# Patient Record
Sex: Male | Born: 1940 | Race: White | Hispanic: No | Marital: Married | Smoking: Never smoker
Health system: Southern US, Community
[De-identification: ages and names within clinical notes are randomized; demographics above are authoritative.]

## PROBLEM LIST (undated history)

## (undated) DIAGNOSIS — I2510 Atherosclerotic heart disease of native coronary artery without angina pectoris: Secondary | ICD-10-CM

## (undated) DIAGNOSIS — K449 Diaphragmatic hernia without obstruction or gangrene: Secondary | ICD-10-CM

## (undated) DIAGNOSIS — K219 Gastro-esophageal reflux disease without esophagitis: Secondary | ICD-10-CM

## (undated) DIAGNOSIS — E785 Hyperlipidemia, unspecified: Secondary | ICD-10-CM

## (undated) DIAGNOSIS — I10 Essential (primary) hypertension: Secondary | ICD-10-CM

## (undated) DIAGNOSIS — Z950 Presence of cardiac pacemaker: Secondary | ICD-10-CM

## (undated) HISTORY — DX: Diaphragmatic hernia without obstruction or gangrene: K44.9

## (undated) HISTORY — PX: CHOLECYSTECTOMY: SHX55

## (undated) HISTORY — DX: Essential (primary) hypertension: I10

## (undated) HISTORY — DX: Gastro-esophageal reflux disease without esophagitis: K21.9

## (undated) HISTORY — DX: Presence of cardiac pacemaker: Z95.0

## (undated) HISTORY — DX: Hyperlipidemia, unspecified: E78.5

## (undated) HISTORY — PX: PACEMAKER PLACEMENT: SHX43

## (undated) HISTORY — DX: Atherosclerotic heart disease of native coronary artery without angina pectoris: I25.10

---

## 2001-05-02 DIAGNOSIS — I2510 Atherosclerotic heart disease of native coronary artery without angina pectoris: Secondary | ICD-10-CM

## 2001-05-02 DIAGNOSIS — Z950 Presence of cardiac pacemaker: Secondary | ICD-10-CM

## 2001-05-02 HISTORY — DX: Presence of cardiac pacemaker: Z95.0

## 2001-05-02 HISTORY — DX: Atherosclerotic heart disease of native coronary artery without angina pectoris: I25.10

## 2005-07-24 ENCOUNTER — Emergency Department: Payer: Self-pay | Admitting: Emergency Medicine

## 2005-07-24 ENCOUNTER — Other Ambulatory Visit: Payer: Self-pay

## 2005-09-13 ENCOUNTER — Inpatient Hospital Stay: Payer: Self-pay | Admitting: Internal Medicine

## 2005-09-13 ENCOUNTER — Other Ambulatory Visit: Payer: Self-pay

## 2005-09-14 ENCOUNTER — Other Ambulatory Visit: Payer: Self-pay

## 2006-02-17 ENCOUNTER — Ambulatory Visit: Payer: Self-pay | Admitting: Gastroenterology

## 2006-05-04 ENCOUNTER — Ambulatory Visit: Payer: Self-pay | Admitting: Gastroenterology

## 2006-07-03 ENCOUNTER — Ambulatory Visit: Payer: Self-pay | Admitting: Surgery

## 2006-07-07 ENCOUNTER — Ambulatory Visit: Payer: Self-pay | Admitting: Surgery

## 2006-10-15 ENCOUNTER — Other Ambulatory Visit: Payer: Self-pay

## 2006-10-15 ENCOUNTER — Emergency Department: Payer: Self-pay

## 2007-10-30 ENCOUNTER — Ambulatory Visit: Payer: Self-pay | Admitting: Gastroenterology

## 2011-01-04 ENCOUNTER — Other Ambulatory Visit: Payer: Self-pay | Admitting: Internal Medicine

## 2011-01-04 MED ORDER — GLIPIZIDE ER 2.5 MG PO TB24: 2.5000 mg | ORAL_TABLET | Freq: Every day | ORAL | Status: DC

## 2011-02-07 ENCOUNTER — Other Ambulatory Visit: Payer: Self-pay | Admitting: Internal Medicine

## 2011-02-08 ENCOUNTER — Encounter: Payer: Self-pay | Admitting: Internal Medicine

## 2011-02-08 ENCOUNTER — Ambulatory Visit (INDEPENDENT_AMBULATORY_CARE_PROVIDER_SITE_OTHER): Payer: Medicare Other | Admitting: Internal Medicine

## 2011-02-08 VITALS — BP 128/70 | HR 91 | Temp 98.3°F | Resp 16 | Ht 73.0 in | Wt 193.0 lb

## 2011-02-08 DIAGNOSIS — I10 Essential (primary) hypertension: Secondary | ICD-10-CM | POA: Insufficient documentation

## 2011-02-08 DIAGNOSIS — K449 Diaphragmatic hernia without obstruction or gangrene: Secondary | ICD-10-CM | POA: Insufficient documentation

## 2011-02-08 DIAGNOSIS — I2510 Atherosclerotic heart disease of native coronary artery without angina pectoris: Secondary | ICD-10-CM | POA: Insufficient documentation

## 2011-02-08 DIAGNOSIS — E785 Hyperlipidemia, unspecified: Secondary | ICD-10-CM

## 2011-02-08 DIAGNOSIS — E119 Type 2 diabetes mellitus without complications: Secondary | ICD-10-CM

## 2011-02-08 DIAGNOSIS — Z950 Presence of cardiac pacemaker: Secondary | ICD-10-CM | POA: Insufficient documentation

## 2011-02-08 DIAGNOSIS — J329 Chronic sinusitis, unspecified: Secondary | ICD-10-CM

## 2011-02-08 DIAGNOSIS — E1165 Type 2 diabetes mellitus with hyperglycemia: Secondary | ICD-10-CM | POA: Insufficient documentation

## 2011-02-08 MED ORDER — ALPRAZOLAM 0.25 MG PO TABS: 0.2500 mg | ORAL_TABLET | Freq: Every evening | ORAL | Status: DC | PRN

## 2011-02-08 MED ORDER — AMOXICILLIN-POT CLAVULANATE 875-125 MG PO TABS: 1.0000 | ORAL_TABLET | Freq: Two times a day (BID) | ORAL | Status: AC

## 2011-02-08 MED ORDER — GLIPIZIDE ER 2.5 MG PO TB24: 2.5000 mg | ORAL_TABLET | Freq: Every day | ORAL | Status: DC

## 2011-02-08 NOTE — Patient Instructions (Addendum)
You need to use Sudafed PE  10 mg every 6 hours until evening to manage the congestion, can use Afrin at bedtime if it makes you jittery.    I am prescribing Augmentin for the infection,  One tablet twice daily for 10 days,  Gargle with salt water for sore throat.   Continue using your cough  Medicine as needed for cough.  You can use Debrox ear drops to soften your ear wax.

## 2011-02-08 NOTE — Progress Notes (Signed)
Subjective:    Patient ID: Edwin Evans, male    DOB: 06-28-1940, 70 y.o.   MRN: [id number]  HPI  Mr. Hodgkin is a 70 yo white male with a history of diabetes mellitus who is here for his  annual exam.  Had a recent bout of esophagitis due to hiatal hernia after lifting something heavy.  His symptoms of esophgael pain are currently  resolved.  He is scheduled to see [facility] for hernia reevaluation. He has been having sinusitis symptoms lasting 4 or 5 days with frontal sinus pain, eye pain, nNasal drainage that is yellow, and hoarseness.  Feel he is generally better but still coughing,  He also had a molar pulled 6 weeks ago, required a bone graft,  Left mandible.  was on antiobiotics then for amoxicillin.  He has reduced his Actos dose  from 45 mg to 30 mg every other day with improved symptomatology  But did not bring a log of his sugars.  No past medical history on file. Current Outpatient Prescriptions on File Prior to Visit  Medication Sig Dispense Refill  . glipiZIDE (GLIPIZIDE XL) 2.5 MG 24 hr tablet Take 1 tablet (2.5 mg total) by mouth daily.  30 tablet  3     Review of Systems  Constitutional: Negative for fever, chills, diaphoresis, activity change, appetite change, fatigue and unexpected weight change.  HENT: Positive for hearing loss, ear pain, congestion, sneezing, dental problem and sinus pressure. Negative for nosebleeds, sore throat, facial swelling, rhinorrhea, drooling, mouth sores, trouble swallowing, neck pain, neck stiffness, voice change, postnasal drip, tinnitus and ear discharge.   Eyes: Negative for photophobia, pain, discharge, redness, itching and visual disturbance.  Respiratory: Negative for apnea, cough, choking, chest tightness, shortness of breath, wheezing and stridor.   Cardiovascular: Negative for chest pain, palpitations and leg swelling.  Gastrointestinal: Negative for nausea, vomiting, abdominal pain, diarrhea, constipation, blood in stool, abdominal  distention, anal bleeding and rectal pain.  Genitourinary: Negative for dysuria, urgency, frequency, hematuria, flank pain, decreased urine volume, scrotal swelling, difficulty urinating and testicular pain.  Musculoskeletal: Negative for myalgias, back pain, joint swelling, arthralgias and gait problem.  Skin: Negative for color change, rash and wound.  Neurological: Negative for dizziness, tremors, seizures, syncope, speech difficulty, weakness, light-headedness, numbness and headaches.  Psychiatric/Behavioral: Negative for suicidal ideas, hallucinations, behavioral problems, confusion, sleep disturbance, dysphoric mood, decreased concentration and agitation. The patient is not nervous/anxious.    BP 128/70  Pulse 91  Temp(Src) 98.3 F (36.8 C) (Oral)  Resp 16  Ht 6\' 1"  (1.854 m)  Wt 193 lb (87.544 kg)  BMI 25.46 kg/m2  SpO2 96%     Objective:   Physical Exam  Constitutional: He is oriented to person, place, and time.  HENT:  Head: Normocephalic and atraumatic.  Right Ear: A middle ear effusion is present.  Left Ear: External ear normal.  Ears:  Mouth/Throat: Oropharynx is clear and moist.  Eyes: Conjunctivae and EOM are normal.  Neck: Normal range of motion. Neck supple. No JVD present. No thyromegaly present.  Cardiovascular: Normal rate, regular rhythm and normal heart sounds.   Pulmonary/Chest: Effort normal and breath sounds normal. He has no wheezes. He has no rales.  Abdominal: Soft. Bowel sounds are normal. He exhibits no mass. There is no tenderness. There is no rebound.  Genitourinary: Prostate normal and penis normal.  Musculoskeletal: Normal range of motion. He exhibits no edema.  Neurological: He is alert and oriented to person, place, and time.  Skin: Skin is warm and dry.  Psychiatric: He has a normal mood and affect.          Assessment & Plan:

## 2011-02-10 ENCOUNTER — Other Ambulatory Visit: Payer: Self-pay | Admitting: Internal Medicine

## 2011-02-11 MED ORDER — HYDRALAZINE HCL 50 MG PO TABS: 50.0000 mg | ORAL_TABLET | Freq: Three times a day (TID) | ORAL | Status: DC

## 2011-03-10 ENCOUNTER — Encounter: Payer: Self-pay | Admitting: Internal Medicine

## 2011-03-14 ENCOUNTER — Other Ambulatory Visit: Payer: Self-pay | Admitting: Internal Medicine

## 2011-03-15 MED ORDER — ASPIRIN 81 MG PO CHEW: 81.0000 mg | CHEWABLE_TABLET | Freq: Every day | ORAL | Status: AC

## 2011-03-15 MED ORDER — GLIPIZIDE ER 2.5 MG PO TB24: 2.5000 mg | ORAL_TABLET | Freq: Every day | ORAL | Status: DC

## 2011-03-21 ENCOUNTER — Telehealth: Payer: Self-pay | Admitting: Internal Medicine

## 2011-03-21 NOTE — Telephone Encounter (Signed)
[phone] Pt called and wants you to call the pharmacy [facility] [phone]  the rx said 1 a day.   Pt stated stated it was to say 2 a day  The rx glipizide xl 2.5mg  2 x daily.

## 2011-03-22 MED ORDER — GLIPIZIDE ER 2.5 MG PO TB24: 2.5000 mg | ORAL_TABLET | Freq: Two times a day (BID) | ORAL | Status: DC

## 2011-06-01 ENCOUNTER — Other Ambulatory Visit: Payer: Self-pay | Admitting: *Deleted

## 2011-06-01 MED ORDER — HYDRALAZINE HCL 50 MG PO TABS: 50.0000 mg | ORAL_TABLET | Freq: Three times a day (TID) | ORAL | Status: DC

## 2011-08-01 ENCOUNTER — Other Ambulatory Visit: Payer: Self-pay | Admitting: Internal Medicine

## 2011-08-01 MED ORDER — GLIPIZIDE ER 2.5 MG PO TB24: 2.5000 mg | ORAL_TABLET | Freq: Two times a day (BID) | ORAL | Status: DC

## 2011-08-09 ENCOUNTER — Ambulatory Visit (INDEPENDENT_AMBULATORY_CARE_PROVIDER_SITE_OTHER): Payer: Medicare Other | Admitting: Internal Medicine

## 2011-08-09 ENCOUNTER — Encounter: Payer: Self-pay | Admitting: Internal Medicine

## 2011-08-09 VITALS — BP 150/82 | HR 70 | Temp 98.6°F | Resp 16 | Wt 192.2 lb

## 2011-08-09 DIAGNOSIS — I10 Essential (primary) hypertension: Secondary | ICD-10-CM

## 2011-08-09 DIAGNOSIS — K219 Gastro-esophageal reflux disease without esophagitis: Secondary | ICD-10-CM

## 2011-08-09 DIAGNOSIS — E1165 Type 2 diabetes mellitus with hyperglycemia: Secondary | ICD-10-CM

## 2011-08-09 DIAGNOSIS — IMO0001 Reserved for inherently not codable concepts without codable children: Secondary | ICD-10-CM

## 2011-08-09 NOTE — Assessment & Plan Note (Addendum)
Overdue for labs  But not fasting so will get hgba1c.,  Urine test and LDL.  Takes fish oil..  //actos was stopped at last vst ue to multiple concerns

## 2011-08-09 NOTE — Progress Notes (Signed)
Patient ID: Edwin Evans, male   DOB: [birth year], 71 y.o.   MRN: [id number]     Patient Active Problem List  Diagnoses  . Diabetes mellitus type 2, uncontrolled  . Hypertension  . Coronary artery disease  . Pacemaker  . Gastroesophageal reflux disease with hiatal hernia    Subjective:  CC:   Chief Complaint  Patient presents with  . Follow-up    HPI:   Edwin Klem Coxis a 71 y.o. male who presents for follow up on diabetes.  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  Attributing it to taking turmeric capsules two daily for the past 3 to four years.    Sleeping 8 hours daily, for the first ttime in e awhile.  Drinks one one glass of wine before bed .  Energy level has improved since stopping Actos but he developed constipation which he has treated with laxatives prn,  Stool softeners,  Extra fiber ,  But attributes resolution to eating oatmeal with flax seed and raisins.  And chopped pecans .  Eats a lot of nuts.    Past Medical History  Diagnosis Date  . Diabetes mellitus   . Hypertension   . Coronary artery disease   . Pacemaker 2003  . GERD (gastroesophageal reflux disease)   . Gastroesophageal reflux disease with hiatal hernia     Past Surgical History  Procedure Date  . Cholecystectomy          The following portions of the patient's history were reviewed and updated as appropriate: Allergies, current medications, and problem list.    Review of Systems:   12 Pt  review of systems was negative except those addressed in the HPI,     History   Social History  . Marital Status: Married    Spouse Name: N/A    Number of Children: N/A  . Years of Education: N/A   Occupational History  . Not on file.   Social History Main Topics  . Smoking status: Never Smoker   . Smokeless tobacco: Never Used  . Alcohol Use: Yes  . Drug Use: No  . Sexually Active: Not on file    Other Topics Concern  . Not on file   Social History Narrative  . No narrative on file    Objective:  BP 150/82  Pulse 70  Temp(Src) 98.6 F (37 C) (Oral)  Resp 16  Wt 192 lb 4 oz (87.204 kg)  SpO2 96%  General appearance: alert, cooperative and appears stated age Ears: normal TM's and external ear canals both ears Throat: lips, mucosa, and tongue normal; teeth and gums normal Neck: no adenopathy, no carotid bruit, supple, symmetrical, trachea midline and thyroid not enlarged, symmetric, no tenderness/mass/nodules Back: symmetric, no curvature. ROM normal. No CVA tenderness. Lungs: clear to auscultation bilaterally Heart: regular rate and rhythm, S1, S2 normal, no murmur, click, rub or gallop Abdomen: soft, non-tender; bowel sounds normal; no masses,  no organomegaly Pulses: 2+ and symmetric Skin: Skin color, texture, turgor normal. No rashes or lesions Lymph nodes: Cervical, supraclavicular, and axillary nodes normal.  Assessment and Plan:  Gastroesophageal reflux disease with hiatal hernia No longer symptomatic since stopping Actos.     Diabetes mellitus type 2, uncontrolled Overdue for labs  But not fasting so will get hgba1c.,  Urine test and LDL.  Takes fish oil..  //actos was stopped at last vst ue to multiple  concerns  Hypertension Not controlled today, but at last visit was fine.  He is currently having a sinus headache and allergies.      Updated Medication List Outpatient Encounter Prescriptions as of 08/09/2011  Medication Sig Dispense Refill  . ALPRAZolam (XANAX) 0.25 MG tablet Take 1 tablet (0.25 mg total) by mouth at bedtime as needed.  30 tablet  2  . aspirin (ASPIRIN CHILDRENS) 81 MG chewable tablet Chew 1 tablet (81 mg total) by mouth daily.  36 tablet  11  . Cholecalciferol (VITAMIN D3) 1000 UNITS CAPS Take 3,000 Units by mouth daily.       . fish oil-omega-3 fatty acids 1000 MG capsule Take 2 g by mouth daily.        [name] glipiZIDE (GLIPIZIDE XL)  2.5 MG 24 hr tablet Take 1 tablet (2.5 mg total) by mouth 2 (two) times daily.  60 tablet  3  . hydrALAZINE (APRESOLINE) 50 MG tablet Take 1 tablet (50 mg total) by mouth 3 (three) times daily.  90 tablet  3  . losartan (COZAAR) 100 MG tablet Take 100 mg by mouth daily.        . TURMERIC PO Take by mouth.        . vitamin B-12 (CYANOCOBALAMIN) 100 MCG tablet Take 50 mcg by mouth daily.        [name] DISCONTD: pioglitazone (ACTOS) 15 MG tablet Take 15 mg by mouth every other day.           Orders Placed This Encounter  Procedures  . Microalbumin / creatinine urine ratio  . Hemoglobin A1c  . Comp Met (CMET)  . Direct LDL  . HM COLONOSCOPY    No Follow-up on file.

## 2011-08-09 NOTE — Patient Instructions (Signed)
Try using Simply saline to flush the pollen from your sinuses.  (twice daily)  I will call you with the results of your labs.

## 2011-08-09 NOTE — Assessment & Plan Note (Signed)
Not controlled today, but at last visit was fine.  He is currently having a sinus headache and allergies.

## 2011-08-09 NOTE — Assessment & Plan Note (Signed)
No longer symptomatic since stopping Actos.

## 2011-08-10 LAB — COMPREHENSIVE METABOLIC PANEL
AST: 18 U/L (ref 0–37)
Albumin: 4.5 g/dL (ref 3.5–5.2)
BUN: 25 mg/dL — ABNORMAL HIGH (ref 6–23)
CO2: 26 mEq/L (ref 19–32)
Calcium: 9.4 mg/dL (ref 8.4–10.5)
Chloride: 103 mEq/L (ref 96–112)
GFR: 95.54 mL/min (ref >60.00)
Glucose, Bld: 245 mg/dL — ABNORMAL HIGH (ref 70–99)
Potassium: 4.3 mEq/L (ref 3.5–5.1)

## 2011-08-10 LAB — MICROALBUMIN / CREATININE URINE RATIO: Microalb Creat Ratio: 0.4 mg/g (ref 0.0–30.0)

## 2011-08-15 ENCOUNTER — Encounter: Payer: Self-pay | Admitting: Internal Medicine

## 2011-09-07 ENCOUNTER — Emergency Department: Payer: Self-pay | Admitting: Emergency Medicine

## 2011-09-07 LAB — COMPREHENSIVE METABOLIC PANEL
Alkaline Phosphatase: 63 U/L (ref 50–136)
BUN: 20 mg/dL — ABNORMAL HIGH (ref 7–18)
Calcium, Total: 9 mg/dL (ref 8.5–10.1)
Chloride: 107 mmol/L (ref 98–107)
Co2: 26 mmol/L (ref 21–32)
Creatinine: 0.86 mg/dL (ref 0.60–1.30)
Glucose: 141 mg/dL — ABNORMAL HIGH (ref 65–99)
Potassium: 4 mmol/L (ref 3.5–5.1)
SGPT (ALT): 24 U/L
Sodium: 141 mmol/L (ref 136–145)
Total Protein: 7.5 g/dL (ref 6.4–8.2)

## 2011-09-07 LAB — URINALYSIS, COMPLETE
Bacteria: NONE SEEN
Bilirubin,UR: NEGATIVE
Glucose,UR: NEGATIVE mg/dL (ref 0–75)
Hyaline Cast: 1
Leukocyte Esterase: NEGATIVE
Nitrite: NEGATIVE
Ph: 6 (ref 4.5–8.0)
Protein: NEGATIVE
Squamous Epithelial: NONE SEEN
WBC UR: <1 /HPF (ref 0–5)

## 2011-09-07 LAB — CBC
MCH: 29.3 pg (ref 26.0–34.0)
MCV: 88 fL (ref 80–100)
Platelet: 143 10*3/uL — ABNORMAL LOW (ref 150–440)
RBC: 5.45 10*6/uL (ref 4.40–5.90)
RDW: 13.6 % (ref 11.5–14.5)
WBC: 7.5 10*3/uL (ref 3.8–10.6)

## 2011-09-07 LAB — CK TOTAL AND CKMB (NOT AT ARMC)
CK, Total: 72 U/L (ref 35–232)
CK-MB: 1.9 ng/mL (ref 0.5–3.6)

## 2011-09-07 LAB — TROPONIN I: Troponin-I: <0.02 ng/mL

## 2011-10-07 ENCOUNTER — Other Ambulatory Visit: Payer: Self-pay | Admitting: Internal Medicine

## 2011-10-08 MED ORDER — ALPRAZOLAM 0.25 MG PO TABS: 0.2500 mg | ORAL_TABLET | Freq: Every evening | ORAL | Status: DC | PRN

## 2011-10-13 ENCOUNTER — Ambulatory Visit: Payer: Self-pay | Admitting: Gastroenterology

## 2011-10-17 ENCOUNTER — Encounter: Payer: Self-pay | Admitting: Internal Medicine

## 2011-11-25 LAB — HEMOGLOBIN A1C: Hgb A1c MFr Bld: 7.5 % — AB (ref 4.0–6.0)

## 2011-12-06 ENCOUNTER — Ambulatory Visit: Payer: Medicare Other | Admitting: Internal Medicine

## 2011-12-09 ENCOUNTER — Ambulatory Visit (INDEPENDENT_AMBULATORY_CARE_PROVIDER_SITE_OTHER): Payer: Medicare Other | Admitting: Internal Medicine

## 2011-12-09 ENCOUNTER — Encounter: Payer: Self-pay | Admitting: Internal Medicine

## 2011-12-09 VITALS — BP 162/70 | HR 60 | Temp 98.0°F | Resp 16 | Wt 189.5 lb

## 2011-12-09 DIAGNOSIS — E1165 Type 2 diabetes mellitus with hyperglycemia: Secondary | ICD-10-CM

## 2011-12-09 DIAGNOSIS — I10 Essential (primary) hypertension: Secondary | ICD-10-CM

## 2011-12-09 DIAGNOSIS — K449 Diaphragmatic hernia without obstruction or gangrene: Secondary | ICD-10-CM

## 2011-12-09 DIAGNOSIS — Z79899 Other long term (current) drug therapy: Secondary | ICD-10-CM

## 2011-12-09 DIAGNOSIS — K219 Gastro-esophageal reflux disease without esophagitis: Secondary | ICD-10-CM

## 2011-12-09 DIAGNOSIS — R079 Chest pain, unspecified: Secondary | ICD-10-CM

## 2011-12-09 DIAGNOSIS — I2510 Atherosclerotic heart disease of native coronary artery without angina pectoris: Secondary | ICD-10-CM

## 2011-12-09 LAB — COMPREHENSIVE METABOLIC PANEL
ALT: 18 U/L (ref 0–53)
AST: 21 U/L (ref 0–37)
Alkaline Phosphatase: 80 U/L (ref 39–117)
Creatinine, Ser: 0.9 mg/dL (ref 0.4–1.5)
Total Bilirubin: 0.8 mg/dL (ref 0.3–1.2)

## 2011-12-09 MED ORDER — HYDRALAZINE HCL 50 MG PO TABS: 50.0000 mg | ORAL_TABLET | Freq: Three times a day (TID) | ORAL | Status: DC

## 2011-12-09 MED ORDER — GLIPIZIDE ER 2.5 MG PO TB24: ORAL_TABLET | ORAL | Status: DC

## 2011-12-09 MED ORDER — GLIPIZIDE 2.5 MG HALF TABLET: 2.5000 mg | ORAL_TABLET | Freq: Two times a day (BID) | ORAL | Status: DC

## 2011-12-09 NOTE — Progress Notes (Signed)
Patient ID: Edwin Evans, male   DOB: October 29, 1940, 71 y.o.   MRN: [id number]  Patient Active Problem List  Diagnosis  . Diabetes mellitus type 2, uncontrolled  . Hypertension  . Coronary artery disease  . Pacemaker  . Gastroesophageal reflux disease with hiatal hernia  . Chest pain at rest  . Hyperlipidemia    Subjective:  CC:   Chief Complaint  Patient presents with  . Follow-up    HPI:   Edwin Cragle Coxis a 71 y.o. male who presents Follow up on diabetes and coronary artery disease  He had his a1c checked at [facility] pharmacy last week and it was 7.5,  which is improved from his previous A1c of 8.06 months ago.  His dietary differences in include cutting back on the oatmeal which he was eating frequently, the instant kind .  Had chest pain in May,  Was observed for 8 hours in ED with 2 sets of enzymes normal.  He was referred to his cardiologist and underwent a stress echo by Dr. [name] which was again positive and patient did have chest pain during it.  He was recommended to have a cardiac cath but has deferred. He has had prior cardiac catheterizations which have showed a distal blockage not amenable to stenting .   He doesn't trust the tests and doesn't want to have another cardiac catheterization unless it is done to the radial artery.  He discussed his symptoms with a non-physician friend who is a cardiology researcher  and is now concerned that his sliding hiatal hernia is the source of his chest pain, since  the pain typically is brought on by eating or bending over.  He was seen  by  [facility] GI.  Had the upper GI /barium study done at [facility] which showed: slight reflux, mild esophageal stricture vs acahalasia (patient not sure).   he would like a second opinion from a different GI and cardiology group.    Past Medical History  Diagnosis Date  . Diabetes mellitus   . Hypertension   . Coronary artery disease 2003    Noncritical stenoses by prior catheterization  . 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 GERD (gastroesophageal reflux disease)   . Gastroesophageal reflux disease with hiatal hernia   . Hyperlipidemia     Past Surgical History  Procedure Date  . Cholecystectomy          The following portions of the patient's history were reviewed and updated as appropriate: Allergies, current medications, and problem list.    Review of Systems:   A comprehensive ROS was done and positive for chest pain  Per HPI. The rest was negative.     History   Social History  . Marital Status: Married    Spouse Name: N/A    Number of Children: N/A  . Years of Education: N/A   Occupational History  . Not on file.   Social History Main Topics  . Smoking status: Never Smoker   . Smokeless tobacco: Never Used  . Alcohol Use: Yes  . Drug Use: No  . Sexually Active: Not on file   Other Topics Concern  . Not on file   Social History Narrative  . No narrative on file    Objective:  BP 162/70  Pulse 60  Temp 98 F (36.7 C) (Oral)  Resp 16  Wt 189 lb 8 oz (85.957 kg)  SpO2 95%  General appearance: alert, cooperative and appears  stated age Ears: normal TM's and external ear canals both ears Throat: lips, mucosa, and tongue normal; teeth and gums normal Neck: no adenopathy, no carotid bruit, supple, symmetrical, trachea midline and thyroid not enlarged, symmetric, no tenderness/mass/nodules Back: symmetric, no curvature. ROM normal. No CVA tenderness. Lungs: clear to auscultation bilaterally Heart: regular rate and rhythm, S1, S2 normal, no murmur, click, rub or gallop Abdomen: soft, non-tender; bowel sounds normal; no masses,  no organomegaly Pulses: 2+ and symmetric Skin: Skin color, texture, turgor normal. No rashes or lesions Lymph nodes: Cervical, supraclavicular, and axillary nodes normal.  Assessment and Plan:  Chest pain at rest Although he does have known coronary artery disease he is reluctant to submit another  cardiac cath without a second opinion from GI and cardiology regarding the source of his pain. I have referred him to Julio Alm as he has requested only male physicians if possible. I have suggested that he will probably need to undergo a repeat endoscopic or upper GI evaluation in order to determine if the hiatal hernia is indeed significant. He is also willing to undergo repeat cardiac catheterization if he can be done to the radial artery approach. Referral to Dr. Kirke Corin is underway as well.   Diabetes mellitus type 2, uncontrolled His A1c has improved from 8.0-7.5 with dietary modification is requesting the ability to adjust his glipizide using the XL form in 2.5 mg doses depending on his activity because he notes that he has frequent lows when he uses his short acting. Reminder for annual eye exams done.  Coronary artery disease His stress echocardiogram done in May by Dr. [name] was positive with reduction in LV function during stress imaging from 50% to 35 and hypokinesis of the anterior and septal walls noted. Patient has known  noncritical coronary artery disease by prior cardiac catheterizations,  last one reportedly 2003 at an outside institution but would like a second opinion on whether his current episodes of chest pain are due to  his sliding hiatal hernia or ischemia.  His LDL is not at goal because he refuses to take statins due to prior history of myalgias.  Hypertension Labile, elevated today. However home blood pressures have been 140/80. He has history of intolerance to ACE inhibitors and  beta blockers  Gastroesophageal reflux disease with hiatal hernia His upper GI study done at [facility] in June showed mild to moderate esophageal dysmotility, a small sliding hiatal hernia and minimal reflux   Updated Medication List Outpatient Encounter Prescriptions as of 12/09/2011  Medication Sig Dispense Refill  . ALPRAZolam (XANAX) 0.25 MG tablet Take 1 tablet (0.25 mg total) by mouth at  bedtime as needed.  30 tablet  2  . aspirin (ASPIRIN CHILDRENS) 81 MG chewable tablet Chew 1 tablet (81 mg total) by mouth daily.  36 tablet  11  . Cholecalciferol (VITAMIN D3) 1000 UNITS CAPS Take 3,000 Units by mouth daily.       [name] CINNAMON PO Take by mouth 3 (three) times daily after meals.      [name] co-enzyme Q-10 30 MG capsule Take 30 mg by mouth 3 (three) times daily.      . fish oil-omega-3 fatty acids 1000 MG capsule Take 2 g by mouth daily.        . hydrALAZINE (APRESOLINE) 50 MG tablet Take 1 tablet (50 mg total) by mouth 3 (three) times daily.  90 tablet  3  . losartan (COZAAR) 100 MG tablet Take 100 mg by mouth daily.        [name]  TURMERIC PO Take by mouth.        . vitamin B-12 (CYANOCOBALAMIN) 100 MCG tablet Take 50 mcg by mouth daily.        [name] DISCONTD: glipiZIDE (GLIPIZIDE XL) 2.5 MG 24 hr tablet Take 1 tablet (2.5 mg total) by mouth 2 (two) times daily.  60 tablet  3  . DISCONTD: hydrALAZINE (APRESOLINE) 50 MG tablet Take 1 tablet (50 mg total) by mouth 3 (three) times daily.  90 tablet  3  . glipiZIDE (GLIPIZIDE XL) 2.5 MG 24 hr tablet 1 or 2 tablets in the morning   One at dinner  90 tablet  3  . DISCONTD: glipiZIDE (GLUCOTROL) 2.5 mg TABS Take 0.5 tablets (2.5 mg total) by mouth 2 (two) times daily before a meal. May take 2 tablets in the morning if needed  45 tablet  6     Orders Placed This Encounter  Procedures  . Comprehensive metabolic panel  . Ambulatory referral to Gastroenterology    No Follow-up on file.

## 2011-12-09 NOTE — Patient Instructions (Signed)
For your constipation, eating  The low carb  Whole wheat tortilla by [employer] will give you more soluble fiber and less carbs than your oatmeal.    I agree with adjusting your glipizide dose based on your activity level.

## 2011-12-11 ENCOUNTER — Encounter: Payer: Self-pay | Admitting: Internal Medicine

## 2011-12-11 DIAGNOSIS — E785 Hyperlipidemia, unspecified: Secondary | ICD-10-CM | POA: Insufficient documentation

## 2011-12-11 NOTE — Assessment & Plan Note (Signed)
Although he does have known coronary artery disease he is reluctant to submit another cardiac cath without a second opinion from GI and cardiology regarding the source of his pain. I have referred him to Edwin Evans as he has requested only male physicians if possible. I have suggested that he will probably need to undergo a repeat endoscopic or upper GI evaluation in order to determine if the hiatal hernia is indeed significant. He is also willing to undergo repeat cardiac catheterization if he can be done to the radial artery approach. Referral to Dr. Kirke Evans is underway as well.

## 2011-12-11 NOTE — Assessment & Plan Note (Signed)
His upper GI study done at [facility] in June showed mild to moderate esophageal dysmotility, a small sliding hiatal hernia and minimal reflux

## 2011-12-11 NOTE — Assessment & Plan Note (Signed)
Labile, elevated today. However home blood pressures have been 140/80. He has history of intolerance to ACE inhibitors and  beta blockers

## 2011-12-11 NOTE — Assessment & Plan Note (Signed)
His A1c has improved from 8.0-7.5 with dietary modification is requesting the ability to adjust his glipizide using the XL form in 2.5 mg doses depending on his activity because he notes that he has frequent lows when he uses his short acting. Reminder for annual eye exams done.

## 2011-12-11 NOTE — Assessment & Plan Note (Addendum)
His stress echocardiogram done in May by Dr. [name] was positive with reduction in LV function during stress imaging from 50% to 35 and hypokinesis of the anterior and septal walls noted. Patient has known  noncritical coronary artery disease by prior cardiac catheterizations,  last one reportedly 2003 at an outside institution but would like a second opinion on whether his current episodes of chest pain are due to  his sliding hiatal hernia or ischemia.  His LDL is not at goal because he refuses to take statins due to prior history of myalgias.

## 2011-12-19 ENCOUNTER — Encounter: Payer: Self-pay | Admitting: Internal Medicine

## 2012-01-13 ENCOUNTER — Encounter: Payer: Self-pay | Admitting: Internal Medicine

## 2012-01-13 NOTE — Progress Notes (Signed)
Patient was scheduled to see Dr Juanda Chance 01/31/12 under the presumption that he had no GI history. However, upon reading patient's chart notes, it is evident that he has been followed by [facility]. [facility] saw [facility] last on 10/31/11 and has a follow up scheduled on 02/06/12. Since we do not do second opinions, patient will need to continue follow up with [facility] or if he wishes, he may have his records from [place] sent to [place] for Dr Juanda Chance to review and decide whether she will accept him as a patient.

## 2012-01-31 ENCOUNTER — Ambulatory Visit: Payer: Medicare Other | Admitting: Internal Medicine

## 2012-02-13 ENCOUNTER — Telehealth: Payer: Self-pay | Admitting: Internal Medicine

## 2012-02-13 NOTE — Telephone Encounter (Signed)
Pt is calling concerning some records getting to Dr. Juanda Chance. He says he spoke with someone about this but he wasn't sure who it was. It may have been [place] ??? Don't see any record of this in the chart.   Please call patients cell phone if need to be reached [phone].

## 2012-04-30 ENCOUNTER — Other Ambulatory Visit: Payer: Self-pay | Admitting: Internal Medicine

## 2012-04-30 NOTE — Telephone Encounter (Signed)
Hydralazine 50 mg tablet  Take 2 tablets three times daily  # 180

## 2012-05-01 MED ORDER — HYDRALAZINE HCL 50 MG PO TABS: 50.0000 mg | ORAL_TABLET | Freq: Three times a day (TID) | ORAL | Status: AC

## 2012-05-01 NOTE — Telephone Encounter (Signed)
Med filled.  

## 2012-05-14 ENCOUNTER — Other Ambulatory Visit: Payer: Self-pay | Admitting: Internal Medicine

## 2012-05-14 MED ORDER — GLIPIZIDE ER 2.5 MG PO TB24: ORAL_TABLET | ORAL | Status: DC

## 2012-05-14 NOTE — Telephone Encounter (Signed)
glipiZIDE (GLIPIZIDE XL) 2.5 MG 24 hr tablet   # 90

## 2012-05-14 NOTE — Telephone Encounter (Signed)
Med filled.  

## 2012-07-02 ENCOUNTER — Other Ambulatory Visit: Payer: Self-pay | Admitting: *Deleted

## 2012-07-02 MED ORDER — ALPRAZOLAM 0.25 MG PO TABS: 0.2500 mg | ORAL_TABLET | Freq: Every evening | ORAL | Status: DC | PRN

## 2012-07-02 NOTE — Telephone Encounter (Signed)
Ok to refill,  Authorized in epic 

## 2012-07-02 NOTE — Telephone Encounter (Signed)
Called to pharmacy 

## 2012-07-25 ENCOUNTER — Ambulatory Visit (INDEPENDENT_AMBULATORY_CARE_PROVIDER_SITE_OTHER): Payer: Medicare Other | Admitting: Internal Medicine

## 2012-07-25 ENCOUNTER — Encounter: Payer: Self-pay | Admitting: Internal Medicine

## 2012-07-25 VITALS — BP 123/79 | HR 72 | Temp 98.1°F | Resp 16 | Wt 184.8 lb

## 2012-07-25 DIAGNOSIS — K449 Diaphragmatic hernia without obstruction or gangrene: Secondary | ICD-10-CM

## 2012-07-25 DIAGNOSIS — E785 Hyperlipidemia, unspecified: Secondary | ICD-10-CM

## 2012-07-25 DIAGNOSIS — Z1211 Encounter for screening for malignant neoplasm of colon: Secondary | ICD-10-CM

## 2012-07-25 DIAGNOSIS — I10 Essential (primary) hypertension: Secondary | ICD-10-CM

## 2012-07-25 DIAGNOSIS — K219 Gastro-esophageal reflux disease without esophagitis: Secondary | ICD-10-CM

## 2012-07-25 DIAGNOSIS — E1165 Type 2 diabetes mellitus with hyperglycemia: Secondary | ICD-10-CM

## 2012-07-25 DIAGNOSIS — R079 Chest pain, unspecified: Secondary | ICD-10-CM

## 2012-07-25 DIAGNOSIS — I2510 Atherosclerotic heart disease of native coronary artery without angina pectoris: Secondary | ICD-10-CM

## 2012-07-25 DIAGNOSIS — E119 Type 2 diabetes mellitus without complications: Secondary | ICD-10-CM

## 2012-07-25 NOTE — Patient Instructions (Addendum)
Return for fasting labs  Let me know which doctor you want the referral to for your hernia     All of the foods can be found at grocery stores and in bulk at [employer].  The Atkins protein bars and shakes are available in more varieties at Target, WalMart and Lowe's Foods.     7 AM Breakfast:  Choose from the following:  Low carbohydrate Protein  Shakes (I recommend the EAS AdvantEdge "Carb Control" shakes  Or the low carb shakes by Atkins.    2.5 carbs   Arnold's "Sandwhich Thin"toasted  w/ peanut butter (no jelly: about 20 net carbs  "Bagel Thin" with cream cheese and salmon: about 20 carbs   a scrambled egg/bacon/cheese burrito made with Mission's "carb balance" whole wheat tortilla  (about 10 net carbs )   Avoid cereal and bananas, oatmeal and cream of wheat and grits. They are loaded with carbohydrates!   10 AM: high protein snack  Protein bar by Atkins (the snack size, under 200 cal, usually < 6 net carbs).    A stick of cheese:  Around 1 carb,  100 cal     Dannon Light n Fit [place] Yogurt  (80 cal, 8 carbs)  Other so called "protein bars" and Greek yogurts tend to be loaded with carbohydrates.  Remember, in food advertising, the word "energy" is synonymous for " carbohydrate."  Lunch:   A Sandwich using the bread choices listed, Can use any  Eggs,  lunchmeat, grilled meat or canned tuna), avocado, regular mayo/mustard  and cheese.  A Salad using clue cheese, ranch,  Goddess or vinagrette,  No croutons or "confetti" and no "candied nuts" but regular nuts OK.   No pretzels or chips.  Pickles and miniature sweet peppers are a good low carb alternative  The bread is the only source or carbohydrate that can be decreased (Joseph's makes a pita bread and a flat bread that are 50 cal and 4 net carbs ; Toufayan makes a low carb flatbread that's 100 cal and 9 net carbs  and  Mission's carb balance whole wheat tortilla  That is 210 cal and 6 net carbs) Avoid "Low fat dressings, as well as  Reyne Dumas and [address] dressings They are loaded with sugar!   3 PM/ Mid day  Snack:  Consider  1 ounce of  almonds, walnuts, pistachios, pecans, peanuts,  Macadamia nuts or a nut medley.  Avoid "granola"; the dried cranberries and raisins are loaded with carbohydrates. Mixed nuts ok if no raisins or cranberries or dried fruit.     6 PM  Dinner:    "mean and green, "  Meat/chicken/fish with a green salad, and broccoli, cauliflower, green beans, spinach, brussel sprouts or  Lima beans::       There is a low carb pasta by Dreamfield's available at [facility] that is acceptable and tastes great only 5 diestible carbs/serving.   Try Michel Angelo's chicken piccata or chicken or eggplant parm over low carb pasta.   Clifton Custard Sanchez's "Carnitas" (pulled pork, no sauce,  0 carbs) or his beef pot roast to make a dinner burrito  Whole wheat pasta is still full of digestible carbs and  Not as low in glycemic index as Dreamfield's.   Brown rice is still rice,  So skip the rice and noodles if you eat [place] or [place]  9 PM snack :   Breyer's "low carb" fudgsicle or  ice cream bar (Carb Smart line), or  Weight Watcher's ice cream bar , or another "no sugar added" ice cream;  a serving of fresh berries/cherries with whipped cream   Cheese or yoguty  Avoid bananas, pineapple, grapes  and watermelon on a regular basis because they are high in sugar)   Remember that snack Substitutions should be less than 10 carbs per serving and meals < 20 carbs. Remember to subtract fiber grams to get the "net carbs."

## 2012-07-25 NOTE — Progress Notes (Signed)
Patient ID: Edwin Evans, male   DOB: 1940-09-10, 72 y.o.   MRN: [id number]  Patient Active Problem List  Diagnosis  . Diabetes mellitus type 2, uncontrolled  . Hypertension  . Coronary artery disease  . Pacemaker  . Gastroesophageal reflux disease with hiatal hernia  . Chest pain at rest  . Hyperlipidemia  . Screening for colon cancer    Subjective:  CC:   Chief Complaint  Patient presents with  . discuss health issues    HPI:   Edwin Evans a 72 y.o. male who presents for 6 month follow up on chronic conditions including diabetes mellitus Type 2 , hypertension, hyperlipidemia. CAD/  He has chronic joint pain secondary to OA. He has been taking turmeric twice daily for his joint pain with moderate results. He is also increased his fish oil to 3600 mg daily in an effort to avoid cholesterol medication.  He does not check his blood sugars. His last A1c was done at [facility] and was 7.5 in September.  He walks regularly for exercise. He eats bread and other complex carbohydrates in moderation after discussing his diet with me via e-mail several months ago.   Recurrent reflux induced chest pain.[name]  He has known cardiac disease and has been evaluated in the ER during episodes of chest pain with negative cardiac enzymes. His cardiologist is [name] who has evaluated him noninvasively for ischemia. Patient had a positive stress test in May 2013 but continues to defer cardiac catheterization. 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 His chest pain occurs when he bends over. 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       Past Medical History  Diagnosis Date  . Diabetes mellitus   . Hypertension   . Coronary artery disease 2003    Noncritical stenoses by prior catheterization  . 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 GERD  (gastroesophageal reflux disease)   . Gastroesophageal reflux disease with hiatal hernia   . Hyperlipidemia     Past Surgical History  Procedure Laterality Date  . Cholecystectomy    . Pacemaker placement         The following portions of the patient's history were reviewed and updated as appropriate: Allergies, current medications, and problem list.    Review of Systems:  Patient denies headache, fevers, malaise, unintentional weight loss, skin rash, eye pain, sinus congestion and sinus pain, sore throat, dysphagia,  hemoptysis , cough, dyspnea, wheezing, chest pain, palpitations, orthopnea, edema, abdominal pain, nausea, melena, diarrhea, constipation, flank pain, dysuria, hematuria, urinary  Frequency, nocturia, numbness, tingling, seizures,  Focal weakness, Loss of consciousness,  Tremor, insomnia, depression, anxiety, and suicidal ideation.     History   Social History  . Marital Status: Married    Spouse Name: N/A    Number of Children: N/A  . Years of Education: N/A   Occupational History  . Not on file.   Social History Main Topics  . Smoking status: Never Smoker   . Smokeless tobacco: Never Used  . Alcohol Use: Yes  . Drug Use: No  . Sexually Active: Not on file   Other Topics Concern  . Not on file   Social History Narrative  . No narrative on file    Objective:  BP 123/79  Pulse 72  Temp(Src) 98.1 F (36.7 C) (  Oral)  Resp 16  Wt 184 lb 12 oz (83.802 kg)  BMI 24.38 kg/m2  SpO2 97%  General appearance: alert, cooperative and appears stated age Ears: normal TM's and external ear canals both ears Throat: lips, mucosa, and tongue normal; teeth and gums normal Neck: no adenopathy, no carotid bruit, supple, symmetrical, trachea midline and thyroid not enlarged, symmetric, no tenderness/mass/nodules Back: symmetric, no curvature. ROM normal. No CVA tenderness. Lungs: clear to auscultation bilaterally Heart: regular rate and rhythm, S1, S2 normal, no  murmur, click, rub or gallop Abdomen: soft, non-tender; bowel sounds normal; no masses,  no organomegaly Pulses: 2+ and symmetric Skin: Skin color, texture, turgor normal. No rashes or lesions Lymph nodes: Cervical, supraclavicular, and axillary nodes normal.  Assessment and Plan:  Chest pain at rest Recurrent,  aggravated by bending over. Does not occur with heavy exertion. Patient has thus far deferred invasive testing for evaluation of known CAD with a previously abnormal Myoview. His cardiologist [name]) is of the opinion that his cardiac disease is stable and that hisre current symptoms are coming from hiatal hernia. Patient is requesting advice on surgical correction of his hiatal hernia. He likes [name] for local surgery, and I agree I also think he should talk to somebody at a tertiary care center that specializes in surgical correction of sliding  hiatal hernias.   Hyperlipidemia Patient continues to manage his hyperlipidemia with diet and fish oil. He is overdue for fasting labs and has been asked to return as soon as possible. He is intolerant to statins.   Diabetes mellitus type 2, uncontrolled Patient is overdue for blood work. She continues to take glipizide and follow a low glycemic index diet and maintain regular exercise. He is statin intolerant, but is taking an angiotensin blocker and reminded to take a baby aspirin daily. He has been asked to return for fasting lipids. He is up-to-date on his eye exams. Foot exam today was normal.  Screening for colon cancer He has continually deferred repeat colonoscopy and has been following up with [name] with fecal occult blood tests annually.  Coronary artery disease His stress echocardiogram done in May 2013 by Dr. [name] was positive with reduction in LV function during stress imaging from 50% to 35 and hypokinesis of the anterior and septal walls noted. Patient has known  noncritical coronary artery disease by  prior cardiac catheterizations,  last one reportedly 2003 at an outside institution but has continually deferred repeat cardiac catheterizations due to the risk of stroke.  A total of 40 minutes was spent with patient more than half of which was spent in counseling, reviewing records from other prviders and coordination of care.  Updated Medication List Outpatient Encounter Prescriptions as of 07/25/2012  Medication Sig Dispense Refill  . ALPRAZolam (XANAX) 0.25 MG tablet Take 1 tablet (0.25 mg total) by mouth at bedtime as needed.  30 tablet  2  . Cholecalciferol (VITAMIN D3) 1000 UNITS CAPS Take 3,000 Units by mouth daily.       [name] CINNAMON PO Take by mouth 3 (three) times daily after meals.      [name] co-enzyme Q-10 30 MG capsule Take 30 mg by mouth 3 (three) times daily.      . fish oil-omega-3 fatty acids 1000 MG capsule Take 2 g by mouth daily.        [name] glipiZIDE (GLIPIZIDE XL) 2.5 MG 24 hr tablet 1 or 2 tablets in the morning   One at dinner  90  tablet  1  . hydrALAZINE (APRESOLINE) 50 MG tablet Take 1 tablet (50 mg total) by mouth 3 (three) times daily.  90 tablet  3  . losartan (COZAAR) 100 MG tablet Take 100 mg by mouth daily.        . TURMERIC PO Take by mouth.        . vitamin B-12 (CYANOCOBALAMIN) 100 MCG tablet Take 50 mcg by mouth daily.        [name] PRILOSEC OTC 20 MG tablet Take 1 tablet by mouth daily.       No facility-administered encounter medications on file as of 07/25/2012.     Orders Placed This Encounter  Procedures  . Fecal Occult Blood, Guaiac  . Hemoglobin A1c  . Lipid panel  . Hemoglobin A1c  . Comprehensive metabolic panel  . Microalbumin / creatinine urine ratio    No Follow-up on file.

## 2012-07-28 ENCOUNTER — Encounter: Payer: Self-pay | Admitting: Internal Medicine

## 2012-07-28 DIAGNOSIS — Z1211 Encounter for screening for malignant neoplasm of colon: Secondary | ICD-10-CM | POA: Insufficient documentation

## 2012-07-28 NOTE — Assessment & Plan Note (Addendum)
Patient is overdue for blood work. She continues to take glipizide and follow a low glycemic index diet and maintain regular exercise. He is statin intolerant, but is taking an angiotensin blocker and reminded to take a baby aspirin daily. He has been asked to return for fasting lipids. He is up-to-date on his eye exams. Foot exam today was normal.

## 2012-07-28 NOTE — Assessment & Plan Note (Signed)
Patient continues to manage his hyperlipidemia with diet and fish oil. He is overdue for fasting labs and has been asked to return as soon as possible. He is intolerant to statins.

## 2012-07-28 NOTE — Assessment & Plan Note (Signed)
Well controlled on current regimen. Renal function assessment is due

## 2012-07-28 NOTE — Assessment & Plan Note (Signed)
His stress echocardiogram done in May 2013 by Dr. [name] was positive with reduction in LV function during stress imaging from 50% to 35 and hypokinesis of the anterior and septal walls noted. Patient has known  noncritical coronary artery disease by prior cardiac catheterizations,  last one reportedly 2003 at an outside institution but has continually deferred repeat cardiac catheterizations due to the risk of stroke.

## 2012-07-28 NOTE — Assessment & Plan Note (Signed)
He has continually deferred repeat colonoscopy and has been following up with [name] with fecal occult blood tests annually.

## 2012-07-28 NOTE — Assessment & Plan Note (Signed)
Recurrent,  aggravated by bending over. Does not occur with heavy exertion. Patient has thus far deferred invasive testing for evaluation of known CAD with a previously abnormal Myoview. His cardiologist [name]) is of the opinion that his cardiac disease is stable and that hisre current symptoms are coming from hiatal hernia. Patient is requesting advice on surgical correction of his hiatal hernia. He likes [name] for local surgery, and I agree I also think he should talk to somebody at a tertiary care center that specializes in surgical correction of sliding  hiatal hernias.

## 2012-08-01 ENCOUNTER — Other Ambulatory Visit (INDEPENDENT_AMBULATORY_CARE_PROVIDER_SITE_OTHER): Payer: Medicare Other

## 2012-08-01 ENCOUNTER — Encounter: Payer: Self-pay | Admitting: General Practice

## 2012-08-01 DIAGNOSIS — E119 Type 2 diabetes mellitus without complications: Secondary | ICD-10-CM

## 2012-08-01 LAB — COMPREHENSIVE METABOLIC PANEL
ALT: 20 U/L (ref 0–53)
BUN: 21 mg/dL (ref 6–23)
CO2: 30 mEq/L (ref 19–32)
Calcium: 9.5 mg/dL (ref 8.4–10.5)
Creatinine, Ser: 0.9 mg/dL (ref 0.4–1.5)
GFR: 84.72 mL/min (ref >60.00)
Total Bilirubin: 1.5 mg/dL — ABNORMAL HIGH (ref 0.3–1.2)

## 2012-08-01 LAB — LIPID PANEL
HDL: 35.1 mg/dL — ABNORMAL LOW (ref >39.00)
Total CHOL/HDL Ratio: 4
Triglycerides: 60 mg/dL (ref 0.0–149.0)
VLDL: 12 mg/dL (ref 0.0–40.0)

## 2012-08-01 LAB — HEMOGLOBIN A1C: Hgb A1c MFr Bld: 8.9 % — ABNORMAL HIGH (ref 4.6–6.5)

## 2012-08-01 LAB — MICROALBUMIN / CREATININE URINE RATIO
Creatinine,U: 64.1 mg/dL
Microalb Creat Ratio: 0.5 mg/g (ref 0.0–30.0)

## 2012-08-01 NOTE — Addendum Note (Signed)
Addended by: Montine Circle D on: 08/01/2012 08:21 AM   Modules accepted: Orders

## 2012-08-01 NOTE — Progress Notes (Signed)
Letter mailed to pt.  

## 2012-08-01 NOTE — Patient Instructions (Addendum)
This is  One version of a  "Low GI"  Diet:  It will still lower your blood sugars and allow you to lose 4 to 8  lbs  per month if you follow it carefully.  Your goal with exercise is a minimum of 30 minutes of aerobic exercise 5 days per week (Walking does not count once it becomes easy!)    All of the foods can be found at grocery stores and in bulk at [employer].  The Atkins protein bars and shakes are available in more varieties at Target, WalMart and [employer] Foods.     7 AM Breakfast:  Choose from the following:  Low carbohydrate Protein  Shakes (I recommend the EAS AdvantEdge "Carb Control" shakes  Or the low carb shakes by Atkins.    2.5 carbs   Arnold's "Sandwhich Thin"toasted  w/ peanut butter (no jelly: about 20 net carbs  "Bagel Thin" with cream cheese and salmon: about 20 carbs   a scrambled egg/bacon/cheese burrito made with Mission's "carb balance" whole wheat tortilla  (about 10 net carbs )   Avoid cereal and bananas, oatmeal and cream of wheat and grits. They are loaded with carbohydrates!   10 AM: high protein snack  Protein bar by Atkins (the snack size, under 200 cal, usually < 6 net carbs).    A stick of cheese:  Around 1 carb,  100 cal     Dannon Light n Fit [place] Yogurt  (80 cal, 8 carbs)  Other so called "protein bars" and Greek yogurts tend to be loaded with carbohydrates.  Remember, in food advertising, the word "energy" is synonymous for " carbohydrate."  Lunch:   A Sandwich using the bread choices listed, Can use any  Eggs,  lunchmeat, grilled meat or canned tuna), avocado, regular mayo/mustard  and cheese.  A Salad using blue cheese, ranch,  Goddess or vinagrette,  No croutons or "confetti" and no "candied nuts" but regular nuts OK.   No pretzels or chips.  Pickles and miniature sweet peppers are a good low carb alternative that provide a "crunch"  The bread is the only source of carbohydrate in a sandwich and  can be decreased by trying some of these alternatives to  traditional loaf bread  Joseph's makes a pita bread and a flat bread that are 50 cal and 4 net carbs available at BJs and [employer]Mart.  This can be toasted to use with hummous as well  Toufayan makes a low carb flatbread that's 100 cal and 9 net carbs available at [employer] and [employer] makes 2 sizes of  Low carb whole wheat tortilla  (The large one is 210 cal and 6 net carbs) Avoid "Low fat dressings, as well as Reyne Dumas and [address] dressings They are loaded with sugar!   3 PM/ Mid day  Snack:  Consider  1 ounce of  almonds, walnuts, pistachios, pecans, peanuts,  Macadamia nuts or a nut medley.  Avoid "granola"; the dried cranberries and raisins are loaded with carbohydrates. Mixed nuts as long as there are no raisins,  cranberries or dried fruit.     6 PM  Dinner:     Any Meat/fowl/fish with a green salad, and either broccoli, cauliflower, green beans, spinach, brussel sprouts or  Lima beans. DO NOT BREAD THE PROTEIN!!  AVOID CORNBREAD, RICE AND POTATO    There is a low carb pasta by Dreamfield's that is acceptable and tastes great: only 5 digestible carbs/serving.( All grocery  stores but BJs carry it in the pasta aisle )  For frozen dinner entrees:  Try Kai Levins Angelo's chicken piccata or chicken or eggplant parm over low carb pasta.(Lowes and BJs) .  Avoid Lean Cuisine, Marie Callender's  Or try [employer] "Carnitas" (pulled pork, no sauce,  0 carbs) or his beef pot roast.  The pulled pork makes a nice a dinner burrito if you use the low carb tortilla and add sour cream, cheese, and salsa  (available at [employer]J's)  Or Pesto over low carb pasta (bj's sells a good quality pesto in the center refrigerated section of the deli   Whole wheat pasta is still full of digestible carbs and  Not as low in glycemic index as Dreamfield's.   Brown rice is still rice,  So skip the rice and noodles if you eat [place] or [place] (or at least limit to 1/2 cup)  9 PM snack :   Breyer's "low carb"  fudgsicle or  ice cream bar (Carb Smart line), or  Weight Watcher's ice cream bar , or another "no sugar added" ice cream;  a serving of fresh berries/cherries with whipped cream   Cheese or DANNON'S LlGHT N FIT [place] YOGURT  Avoid bananas, pineapple, grapes  and watermelon on a regular basis because they are high in sugar.  THINK OF THEM AS DESSERT  Remember that snack Substitutions should be less than 10 NET carbs per serving and meals < 20 carbs. Remember to subtract fiber grams to get the "net carbs."

## 2012-08-03 ENCOUNTER — Telehealth: Payer: Self-pay | Admitting: Internal Medicine

## 2012-08-03 DIAGNOSIS — K449 Diaphragmatic hernia without obstruction or gangrene: Secondary | ICD-10-CM

## 2012-08-03 NOTE — Telephone Encounter (Signed)
Patient wanting a referral to Dr. Lemar Livings.

## 2012-08-03 NOTE — Telephone Encounter (Signed)
Referral is in process as requested 

## 2012-08-06 ENCOUNTER — Telehealth: Payer: Self-pay | Admitting: Emergency Medicine

## 2012-08-06 DIAGNOSIS — E1165 Type 2 diabetes mellitus with hyperglycemia: Secondary | ICD-10-CM

## 2012-08-06 MED ORDER — METFORMIN HCL 500 MG PO TABS: 500.0000 mg | ORAL_TABLET | Freq: Two times a day (BID) | ORAL | Status: DC

## 2012-08-06 NOTE — Telephone Encounter (Signed)
Patient stated his blood sugar has been going up. He states the people he gets his meds from [facility], and they give him the generic for his glipizide, he hasn't taken it in 3 days due to a headache. He states the pharmacy thinks he might need metformin. Could this be an option? Please advise.

## 2012-08-06 NOTE — Assessment & Plan Note (Signed)
Patient has stopped his glipzide against medical advice because he is reporting headache from it,  He is requesting trial of metformin 50 mg bid.  Sent to pharmacy

## 2012-08-06 NOTE — Telephone Encounter (Signed)
Pt notified. States that since he has stopped the glipizide he is feeling better. States that he is going to try the metformin without the glipizide to begin with .

## 2012-08-06 NOTE — Telephone Encounter (Signed)
Sure we can, but it's not very strong so it will not take the place of the glipzide.  The worst side effect is looe stools in some people.  500 mg twice daily is the starting dose.  rx sent to [facility] *not [place])

## 2012-08-07 ENCOUNTER — Telehealth: Payer: Self-pay | Admitting: Internal Medicine

## 2012-08-07 ENCOUNTER — Other Ambulatory Visit: Payer: Self-pay | Admitting: Internal Medicine

## 2012-08-07 DIAGNOSIS — E1165 Type 2 diabetes mellitus with hyperglycemia: Secondary | ICD-10-CM

## 2012-08-07 NOTE — Telephone Encounter (Signed)
Pt came in.  He stated he called yesterday to get a refill on metform pt ask for xl and the rx was sent in for regular metform. Pt also wanted to know if he could get samples of glipiside Please call pt he has several questions about his diabetic meds.

## 2012-08-07 NOTE — Telephone Encounter (Signed)
Please advise. This pt is adamant that he be given the Metformin XL. Please advise.

## 2012-08-07 NOTE — Telephone Encounter (Signed)
Caller: Neo/Patient; Phone: [phone]; Reason for Call: Patient calling about metformin 500mg  Rx.  States he called for the metformin Rx to be called in, and it was, but he states he was Metformin XL, not regular strength.  States has been on metformin as well.  Uses [facility].  Also states since his prescription for glipizide was changed from glucotrol to glipizide generic, he has had headaches and believes it is the generic formulation is causing the headache.  Wants the brand name only.  Info to office for provider review/Rx/callback high priority per patient request for call back today.  May reach patient at [phone].  Krs/can

## 2012-08-07 NOTE — Telephone Encounter (Signed)
Pharmacy Note:  Patient would like to try Metformin ER

## 2012-08-07 NOTE — Telephone Encounter (Signed)
See previous phone note from call a nurse.

## 2012-08-08 ENCOUNTER — Telehealth: Payer: Self-pay | Admitting: Internal Medicine

## 2012-08-08 MED ORDER — METFORMIN HCL ER 750 MG PO TB24: 750.0000 mg | ORAL_TABLET | Freq: Every day | ORAL | Status: DC

## 2012-08-08 NOTE — Telephone Encounter (Signed)
Metformin sent  See note below

## 2012-08-08 NOTE — Telephone Encounter (Signed)
Patient calling about Metformin - has had Metformin XL 750 mg called in.  Says he has corrected his diet - has cut out all of the simple carbs and started exercising.  Says difficulty taking medicines and feels this  too high dose all of a sudden.  Requesting again Metformin XL 500mg  BID at breakfast and supper with meals.  Wanting to be sure formula is 500mg  XL to start and after adjusting may need to increase the dose as needed.  Is currently doing blood sugars 2-3 times per day, today 4/9 has been 167, 152 compared with over 200 each reading when he started.  Uses [facility].   Asking for Dr Darrick Huntsman to call him if needing to before changing Rx since he is wanting to start light and then increase gradually.   Patient can be reached at  Cell  [phone].

## 2012-08-08 NOTE — Telephone Encounter (Signed)
The glipizide has already been switched to xl per chart.  Please confirm with pharmacy  That it has been switched. .  Metformin XR 750 mg sent via e mail

## 2012-08-08 NOTE — Telephone Encounter (Signed)
No,  I will not change the medication to 500 mg twice daily of the XR ,  Because that is actually higher dose than the 750 since it is supposed to be taken once daily,  He needs to let me be the doctor .  Mg XR

## 2012-08-09 ENCOUNTER — Other Ambulatory Visit: Payer: Self-pay | Admitting: General Practice

## 2012-08-09 MED ORDER — GLIPIZIDE ER 2.5 MG PO TB24: 5.0000 mg | ORAL_TABLET | Freq: Every day | ORAL | Status: DC

## 2012-08-09 NOTE — Telephone Encounter (Signed)
Pt notified and was also made an appt for Tuesday in case he had further questions.

## 2012-08-09 NOTE — Telephone Encounter (Signed)
Called pt to inform but there was no answer on the telephone.

## 2012-08-14 ENCOUNTER — Ambulatory Visit (INDEPENDENT_AMBULATORY_CARE_PROVIDER_SITE_OTHER): Payer: Medicare Other | Admitting: Internal Medicine

## 2012-08-14 VITALS — BP 142/80 | HR 69 | Temp 97.7°F | Resp 16 | Wt 184.0 lb

## 2012-08-14 DIAGNOSIS — E1059 Type 1 diabetes mellitus with other circulatory complications: Secondary | ICD-10-CM

## 2012-08-14 DIAGNOSIS — E1165 Type 2 diabetes mellitus with hyperglycemia: Secondary | ICD-10-CM

## 2012-08-14 MED ORDER — METFORMIN HCL ER (MOD) 500 MG PO TB24: 500.0000 mg | ORAL_TABLET | Freq: Every day | ORAL | Status: DC

## 2012-08-14 MED ORDER — METFORMIN HCL ER 750 MG PO TB24: 750.0000 mg | ORAL_TABLET | Freq: Every day | ORAL | Status: DC

## 2012-08-14 NOTE — Progress Notes (Signed)
Patient ID: Edwin Evans, male   DOB: Sep 09, 1940, 72 y.o.   MRN: [id number]  Patient Active Problem List  Diagnosis  . Diabetes mellitus type 2, uncontrolled  . Hypertension  . Coronary artery disease  . Pacemaker  . Gastroesophageal reflux disease with hiatal hernia  . Chest pain at rest  . Hyperlipidemia  . Screening for colon cancer    Subjective:  CC:   Chief Complaint  Patient presents with  . Follow-up    TO talk with MD about generic glipizide causing reaction.    HPI:   Edwin Downard Coxis a 72 y.o. male who presents for follow up on recent labs which indicate deteriorating control of diabetes after being lost to follow up.  Recent A1c is 8.9.  He was prescribed glipizide but due to prior experience with drug, which he claims elevated his sugars paradoxically, he declined the medication and, after speaking with his pharmacist, requested metformin XR. He again requested a change in dose from 750 mg daily to 500 mg bid, which I refused to do.  Patient made an appt to discuss .    Since his a1c was resulted, he has altered his diet to eliminate unnecessary carbohydrates and exercising more . He states that his blood sugars have been 180 or less for the last 48 hours and feels confident he can continue to lower them with minimal medications and dietary modifications.  He has been taking the 750 mg dose of metformin xr .    Past Medical History  Diagnosis Date  . Diabetes mellitus   . Hypertension   . Coronary artery disease 2003    Noncritical stenoses by prior catheterization  . 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 GERD (gastroesophageal reflux disease)   . Gastroesophageal reflux disease with hiatal hernia   . Hyperlipidemia     Past Surgical History  Procedure Laterality Date  . Cholecystectomy    . Pacemaker placement       The following portions of the patient's history were reviewed and updated as appropriate: Allergies, current medications, and problem  list.    Review of Systems:   Patient denies headache, fevers, malaise, unintentional weight loss, skin rash, eye pain, sinus congestion and sinus pain, sore throat, dysphagia,  hemoptysis , cough, dyspnea, wheezing, chest pain, palpitations, orthopnea, edema, abdominal pain, nausea, melena, diarrhea, constipation, flank pain, dysuria, hematuria, urinary  Frequency, nocturia, numbness, tingling, seizures,  Focal weakness, Loss of consciousness,  Tremor, insomnia, depression, anxiety, and suicidal ideation.     History   Social History  . Marital Status: Married    Spouse Name: N/A    Number of Children: N/A  . Years of Education: N/A   Occupational History  . Not on file.   Social History Main Topics  . Smoking status: Never Smoker   . Smokeless tobacco: Never Used  . Alcohol Use: Yes  . Drug Use: No  . Sexually Active: Not on file   Other Topics Concern  . Not on file   Social History Narrative  . No narrative on file    Objective:  BP 142/80  Pulse 69  Temp(Src) 97.7 F (36.5 C) (Oral)  Resp 16  Wt 184 lb (83.462 kg)  BMI 24.28 kg/m2  SpO2 97%  General appearance: alert, cooperative and appears stated age Ears: normal TM's and external ear canals both ears Throat: lips, mucosa, and tongue normal; teeth and gums normal Neck: no adenopathy, no carotid bruit, supple,  symmetrical, trachea midline and thyroid not enlarged, symmetric, no tenderness/mass/nodules Back: symmetric, no curvature. ROM normal. No CVA tenderness. Lungs: clear to auscultation bilaterally Heart: regular rate and rhythm, S1, S2 normal, no murmur, click, rub or gallop Abdomen: soft, non-tender; bowel sounds normal; no masses,  no organomegaly Pulses: 2+ and symmetric Skin: Skin color, texture, turgor normal. No rashes or lesions Lymph nodes: Cervical, supraclavicular, and axillary nodes normal.  Assessment and Plan:  Diabetes mellitus type 2, uncontrolled a1c 8.9.  continue metformin  XR  750 mg daily.  Patient is resistant to more aggressive therapy at this time and historically. Discussed his diet, his exercise regimen, and his need to follow up with monthly check ins until his sugars are under control.   A total of 25 minutes of face to face time was spent with patient more than half of which was spent in counselling and coordination of care   Updated Medication List Outpatient Encounter Prescriptions as of 08/14/2012  Medication Sig Dispense Refill  . ALPRAZolam (XANAX) 0.25 MG tablet Take 1 tablet (0.25 mg total) by mouth at bedtime as needed.  30 tablet  2  . Cholecalciferol (VITAMIN D3) 1000 UNITS CAPS Take 3,000 Units by mouth daily.       [name] CINNAMON PO Take by mouth 3 (three) times daily after meals.      [name] co-enzyme Q-10 30 MG capsule Take 30 mg by mouth 3 (three) times daily.      . fish oil-omega-3 fatty acids 1000 MG capsule Take 2 g by mouth daily.        . hydrALAZINE (APRESOLINE) 50 MG tablet Take 1 tablet (50 mg total) by mouth 3 (three) times daily.  90 tablet  3  . losartan (COZAAR) 100 MG tablet Take 100 mg by mouth daily.        . metFORMIN (GLUCOPHAGE XR) 750 MG 24 hr tablet Take 1 tablet (750 mg total) by mouth daily with breakfast.  90 tablet  3  . PRILOSEC OTC 20 MG tablet Take 1 tablet by mouth daily.      . TURMERIC PO Take by mouth.        . vitamin B-12 (CYANOCOBALAMIN) 100 MCG tablet Take 50 mcg by mouth daily.        . [DISCONTINUED] glipiZIDE (GLUCOTROL XL) 2.5 MG 24 hr tablet Take 2 tablets (5 mg total) by mouth daily.  30 tablet  0  . [DISCONTINUED] metFORMIN (GLUCOPHAGE XR) 500 MG (MOD) 24 hr tablet Take 1 tablet (500 mg total) by mouth daily with breakfast.  90 tablet  3  . [DISCONTINUED] metFORMIN (GLUCOPHAGE XR) 750 MG 24 hr tablet Take 1 tablet (750 mg total) by mouth daily with breakfast.  90 tablet  3   No facility-administered encounter medications on file as of 08/14/2012.     Orders Placed This Encounter  Procedures  . Hemoglobin  A1c  . Comprehensive metabolic panel  . Lipid panel    No Follow-up on file.

## 2012-08-14 NOTE — Patient Instructions (Addendum)
Continue metformin XR 750 mg daily  Avoid bench presses and squats,  They are aggravating your hernia

## 2012-08-15 ENCOUNTER — Encounter: Payer: Self-pay | Admitting: Internal Medicine

## 2012-08-15 NOTE — Assessment & Plan Note (Signed)
a1c 8.9.  continue metformin  XR 750 mg daily.  Patient is resistant to more aggressive therapy at this time and historically.

## 2012-08-20 ENCOUNTER — Ambulatory Visit (INDEPENDENT_AMBULATORY_CARE_PROVIDER_SITE_OTHER): Payer: Medicare Other | Admitting: General Surgery

## 2012-08-20 ENCOUNTER — Encounter: Payer: Self-pay | Admitting: General Surgery

## 2012-08-20 VITALS — BP 164/80 | HR 80 | Resp 14 | Ht 73.0 in | Wt 182.0 lb

## 2012-08-20 DIAGNOSIS — R079 Chest pain, unspecified: Secondary | ICD-10-CM

## 2012-08-20 NOTE — Progress Notes (Addendum)
Patient ID: Edwin Evans, male   DOB: February 03, 1941, 72 y.o.   MRN: [id number]  Chief Complaint  Patient presents with  . Hiatal Hernia    HPI Edwin Evans is a 72 y.o. male who presents for an evaluation of a hernia. He states he has had this problem for approximately 7-8 years. The symptoms have been getting worse over the last 2 years.The symptoms he experiences is a feeling of passing out as well as pressure. He states he gets sweaty.  These episodes are precipitated by bending, especially after a meal. He is not appreciated any dietary intolerance. In part the symptoms were present prior to his cholecystectomy, but have become more pronounced of late.  HPI  Past Medical History  Diagnosis Date  . Diabetes mellitus   . Hypertension   . Coronary artery disease 2003    Noncritical stenoses by prior catheterization  . 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 GERD (gastroesophageal reflux disease)   . Gastroesophageal reflux disease with hiatal hernia   . Hyperlipidemia     Past Surgical History  Procedure Laterality Date  . Cholecystectomy    . Pacemaker placement      Family History  Problem Relation Age of Onset  . Arthritis Mother   . Cancer Mother   . Diabetes Mother   . Arthritis Father   . Hyperlipidemia Father   . Heart disease Father     Social History History  Substance Use Topics  . Smoking status: Never Smoker   . Smokeless tobacco: Never Used  . Alcohol Use: Yes    Allergies  Allergen Reactions  . Ace Inhibitors   . Beta Adrenergic Blockers   . Calcium Channel Blockers   . Metformin And Related     Current Outpatient Prescriptions  Medication Sig Dispense Refill  . ALPRAZolam (XANAX) 0.25 MG tablet Take 1 tablet (0.25 mg total) by mouth at bedtime as needed.  30 tablet  2  . aspirin 81 MG tablet Take 81 mg by mouth daily.      . Cholecalciferol (VITAMIN D3) 1000 UNITS CAPS Take 3,000 Units by mouth daily.       [name] CINNAMON PO Take by mouth 3  (three) times daily after meals.      [name] co-enzyme Q-10 30 MG capsule Take 30 mg by mouth 3 (three) times daily.      . fish oil-omega-3 fatty acids 1000 MG capsule Take 2 g by mouth daily.        . hydrALAZINE (APRESOLINE) 50 MG tablet Take 1 tablet (50 mg total) by mouth 3 (three) times daily.  90 tablet  3  . losartan (COZAAR) 100 MG tablet Take 100 mg by mouth daily.        . metFORMIN (GLUCOPHAGE XR) 750 MG 24 hr tablet Take 1 tablet (750 mg total) by mouth daily with breakfast.  90 tablet  3  . PRILOSEC OTC 20 MG tablet Take 1 tablet by mouth daily.      . TURMERIC PO Take 1,000 mg by mouth daily.       . vitamin B-12 (CYANOCOBALAMIN) 100 MCG tablet Take 50 mcg by mouth daily.         No current facility-administered medications for this visit.    Review of Systems Review of Systems  Constitutional: Negative.   Respiratory: Positive for chest tightness and wheezing. Negative for cough, choking, shortness of breath and stridor.   Cardiovascular: Positive for chest  pain. Negative for palpitations and leg swelling.  Gastrointestinal: Positive for abdominal pain. Negative for nausea, vomiting, diarrhea, constipation, blood in stool, anal bleeding and rectal pain.    Blood pressure 164/80, pulse 80, resp. rate 14, height 6\' 1"  (1.854 m), weight 182 lb (82.555 kg).  Physical Exam Physical Exam  Constitutional: He appears well-developed and well-nourished.  Neck: Trachea normal. No mass and no thyromegaly present.  Cardiovascular: Normal rate, regular rhythm, normal heart sounds and normal pulses.   No murmur heard. Pulmonary/Chest: Effort normal and breath sounds normal.  Abdominal: Soft. Normal appearance and bowel sounds are normal. There is no tenderness.    Data Reviewed The 2009 and 2013 upper GI studies were reviewed. No evidence of stricture, esophagitis or significant reflux. Small hiatal hernia. No evidence of paraesophageal hernia. No evidence of gastric outlet  obstruction.  Assessment    Atypical chest pain.     Plan    The patient had failure to improve his right upper quadrant pain/epigastric pain for 6 months after his cholecystectomy. It's unlikely with chronic cholecystitis was the source of his symptoms.  The present symptom complex of retrosternal pain when he has been bending, relieved with long periods of brisk walking is difficult to explain on either cardiac or GI tract basis.  His last endoscopic exam were in the early 2000's, shortly after his coronary event requiring stent placement.  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  The patient will likely benefit from an upper endoscopy a minimal to help determine if any inflammatory changes in the esophagus or evident. It's unlikely with his in frequent symptoms that the esophageal pH monitoring would be able to capture an event. If his upper endoscopy is negative, a trial of sublingual nitroglycerin during these episodes could help lower fire while her esophageal spasm as the culprit.     The official upper GI report from October 13, 2011 suggested mild or even moderate esophageal dysmotility. Tertiary contractions were identified during swallowing. Minimal gastroesophageal reflux. No abnormality of the stomach or duodenal bulb. Esophageal spasm could certainly present as severe retrosternal pain and may account for the patient's present symptoms. The study had been ordered by Barnetta Chapel, M.D. From the GI department. Records of Dr. Reyes Ivan evaluation will be requested.  Edwin Evans 08/21/2012, 10:03 PM

## 2012-08-20 NOTE — Patient Instructions (Signed)
Patient advised that our office will contact him once Dr. Lemar Livings has talked to Dr. Darrick Huntsman.

## 2012-08-21 ENCOUNTER — Ambulatory Visit: Payer: Self-pay | Admitting: General Surgery

## 2012-08-21 ENCOUNTER — Encounter: Payer: Self-pay | Admitting: General Surgery

## 2012-08-24 ENCOUNTER — Encounter: Payer: Self-pay | Admitting: General Surgery

## 2012-08-24 NOTE — Progress Notes (Signed)
Patient ID: Edwin Evans, male   DOB: 1941-03-25, 72 y.o.   MRN: [id number] Records from Sharlotte Alamo, NP working collaboration with Barnetta Chapel, M.D. from January, June and July 2013 were reviewed.  At the time of visit January 2013 exam he had had self-limiting reflux.   In June 2013 he had one episode of heartburn and was concerned about his hiatal hernia. He had had an abnormal stress test at that time under the care of [name], M.D. but declined a cardiac catheterization. He was convinced in June of 2013 his hiatal hernia was the source of his abdominal discomfort.  At the time of his July 2013 exam he is undergone testing for H. pylori which was negative. Upper GI did show esophageal spasm as noted in the past blood pressure this facet was elevated at 183/88.  The GI study just mild to moderate esophageal spasm, and this could very well account for his present symptoms. There is certainly a question of a cardiac component, but the patient declines to have a cardiac catheterization completed.  Based on review of his records, will arrange for a followup exam to discuss a trial of sublingual nitroglycerin during these episodes in the event that esophageal spasm or cardiac ischemia is the source of his discomfort.

## 2012-08-28 ENCOUNTER — Telehealth: Payer: Self-pay | Admitting: *Deleted

## 2012-08-28 NOTE — Telephone Encounter (Signed)
Message copied by Nicholes Mango on Tue Aug 28, 2012 12:20 PM ------      Message from: [place], [place]      Created: Fri Aug 24, 2012  6:31 PM       Please notify the patient that I reviewed his records from the GI department. I would like to see him in about a month to review options for management. Thank you ------

## 2012-08-28 NOTE — Telephone Encounter (Signed)
Patient notified as instructed and verbalizes understanding. He will follow up in the office on 09-27-12.

## 2012-09-05 ENCOUNTER — Encounter: Payer: Self-pay | Admitting: General Surgery

## 2012-09-27 ENCOUNTER — Ambulatory Visit: Payer: Medicare Other | Admitting: General Surgery

## 2012-10-01 ENCOUNTER — Ambulatory Visit (INDEPENDENT_AMBULATORY_CARE_PROVIDER_SITE_OTHER): Payer: Medicare Other | Admitting: General Surgery

## 2012-10-01 ENCOUNTER — Encounter: Payer: Self-pay | Admitting: General Surgery

## 2012-10-01 VITALS — BP 166/80 | HR 72 | Resp 14 | Ht 73.0 in | Wt 181.0 lb

## 2012-10-01 DIAGNOSIS — R079 Chest pain, unspecified: Secondary | ICD-10-CM

## 2012-10-01 NOTE — Progress Notes (Signed)
Patient ID: Edwin Evans, male   DOB: [birth year], 72 y.o.   MRN: [id number]  Chief Complaint  Patient presents with  . Other    HPI Edwin Evans is a 72 y.o. male here today to discuss his intermittent episodes of chest pain which she feels are related to a hiatal hernia. His history was again reviewed. Over the past year he has had 2 episodes of severe epigastric/retrosternal pain which she described as a pressure-like sensation. 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 His symptoms persisted for about 3-4 weeks at that time, finally relieved by a vigorous walking program. Prior to this episode one year ago he had been involved with marked physical activity of putting up hundreds of bundles of hay without any symptoms of chest pain or shortness of breath. His second episode occurred this past when her again after a period of strenuous lifting. This one was not as severe but lingered longer urge here were that he was unable to take part in his normal exercise program because weight lifting increased his discomfort. During the episodes that he is symptomatic meals followed by bending will exacerbate his symptoms but otherwise he has no significant reflux, postprandial discomfort or anything else to suggest a hiatal hernia/reflux as the source of his symptoms.  HPI  Past Medical History  Diagnosis Date  . Diabetes mellitus   . Hypertension   . Coronary artery disease 2003    Noncritical stenoses by prior catheterization  . 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 GERD (gastroesophageal reflux disease)   . Gastroesophageal reflux disease with hiatal hernia   . Hyperlipidemia     Past Surgical History  Procedure Laterality Date  . Cholecystectomy    . Pacemaker placement      Family History  Problem Relation Age of Onset  . Arthritis Mother   . Cancer Mother   . Diabetes Mother   . Arthritis  Father   . Hyperlipidemia Father   . Heart disease Father     Social History History  Substance Use Topics  . Smoking status: Never Smoker   . Smokeless tobacco: Never Used  . Alcohol Use: Yes    Allergies  Allergen Reactions  . Ace Inhibitors   . Beta Adrenergic Blockers   . Calcium Channel Blockers   . Metformin And Related     Current Outpatient Prescriptions  Medication Sig Dispense Refill  . ALPRAZolam (XANAX) 0.25 MG tablet Take 1 tablet (0.25 mg total) by mouth at bedtime as needed.  30 tablet  2  . aspirin 81 MG tablet Take 81 mg by mouth daily.      . Cholecalciferol (VITAMIN D3) 1000 UNITS CAPS Take 3,000 Units by mouth daily.       [name] CINNAMON PO Take by mouth 3 (three) times daily after meals.      [name] co-enzyme Q-10 30 MG capsule Take 30 mg by mouth 3 (three) times daily.      . fish oil-omega-3 fatty acids 1000 MG capsule Take 2 g by mouth daily.        . hydrALAZINE (APRESOLINE) 50 MG tablet Take 1 tablet (50 mg total) by mouth 3 (three) times daily.  90 tablet  3  . losartan (COZAAR) 100 MG tablet Take 100 mg by mouth daily.        . pioglitazone (ACTOS) 15  MG tablet Take 15 mg by mouth daily.      [name] PRILOSEC OTC 20 MG tablet Take 1 tablet by mouth daily.      . TURMERIC PO Take 1,000 mg by mouth daily.       . vitamin B-12 (CYANOCOBALAMIN) 100 MCG tablet Take 50 mcg by mouth daily.         No current facility-administered medications for this visit.    Review of Systems Review of Systems  Respiratory: Negative.   Cardiovascular: Negative.     Blood pressure 166/80, pulse 72, resp. rate 14, height 6\' 1"  (1.854 m), weight 181 lb (82.101 kg).  Physical Exam Physical Exam  Data Reviewed Old records, previous cardiology evaluation as well as previous chart notes.  Assessment    Atypical chest pain, difficult to correlate with the upper GI tract anatomy.     Plan    I can't make a correlation between the patient's symptoms and the soft findings for  a hiatal hernia/esophageal dysmotility. His vigorous, active lifestyle would seem to speak against a cardiac source. He is declined a cardiac catheterization previously recommended by Dr. [name].  I've asked him to get a second opinion regarding his atypical chest pain from a physician while versed and hiatal hernia symptoms at repair. This will be scheduled to meet date with Effie Shy, M.D. Further followup will take place based on Dr. Allayne Butcher assessment.       [name] 10/01/2012, 9:42 PM

## 2012-10-01 NOTE — Patient Instructions (Addendum)
Return after we talk to Doctor.

## 2012-10-09 ENCOUNTER — Ambulatory Visit: Payer: BC Managed Care – PPO | Admitting: General Surgery

## 2012-10-31 ENCOUNTER — Ambulatory Visit: Payer: Medicare Other | Admitting: Internal Medicine

## 2012-11-05 ENCOUNTER — Ambulatory Visit (INDEPENDENT_AMBULATORY_CARE_PROVIDER_SITE_OTHER): Payer: Medicare Other | Admitting: Internal Medicine

## 2012-11-05 ENCOUNTER — Encounter: Payer: Self-pay | Admitting: Internal Medicine

## 2012-11-05 VITALS — BP 158/82 | HR 84 | Temp 98.3°F | Resp 14 | Wt 182.8 lb

## 2012-11-05 DIAGNOSIS — K449 Diaphragmatic hernia without obstruction or gangrene: Secondary | ICD-10-CM

## 2012-11-05 DIAGNOSIS — K219 Gastro-esophageal reflux disease without esophagitis: Secondary | ICD-10-CM

## 2012-11-05 DIAGNOSIS — E1165 Type 2 diabetes mellitus with hyperglycemia: Secondary | ICD-10-CM

## 2012-11-05 DIAGNOSIS — I10 Essential (primary) hypertension: Secondary | ICD-10-CM

## 2012-11-05 DIAGNOSIS — E1059 Type 1 diabetes mellitus with other circulatory complications: Secondary | ICD-10-CM

## 2012-11-05 DIAGNOSIS — E1159 Type 2 diabetes mellitus with other circulatory complications: Secondary | ICD-10-CM

## 2012-11-05 LAB — HEMOGLOBIN A1C: Hgb A1c MFr Bld: 9.7 % — ABNORMAL HIGH (ref 4.6–6.5)

## 2012-11-05 LAB — COMPREHENSIVE METABOLIC PANEL
ALT: 22 U/L (ref 0–53)
AST: 22 U/L (ref 0–37)
CO2: 29 mEq/L (ref 19–32)
Calcium: 9.7 mg/dL (ref 8.4–10.5)
Chloride: 102 mEq/L (ref 96–112)
Creatinine, Ser: 1.1 mg/dL (ref 0.4–1.5)
GFR: 72.8 mL/min (ref >60.00)
Sodium: 139 mEq/L (ref 135–145)
Total Protein: 7 g/dL (ref 6.0–8.3)

## 2012-11-05 LAB — HM DIABETES FOOT EXAM: HM Diabetic Foot Exam: NORMAL

## 2012-11-05 NOTE — Assessment & Plan Note (Signed)
To date he has been resistant to using traditional medications to manage his disease.  He has not tolerated metformin , is not interested in the newer DPP4 Inhibitors or insulin. I have advised him to stop Actos given his history of CAD, continue glipizide and increase to 5 mg bid for cbgs > 160.  Referral to Dr. Renae Fickle at [facility]

## 2012-11-05 NOTE — Patient Instructions (Addendum)
You can claritin (loratidine 10 mg)  ,  Zyrtec (cetirizine 10 mg ),  Or allegra (fexofenadine  180 mg )   .  These should be taken without a decongestant or it will elevate your blood pressure   Continue the saline sinus rinses twcie daily and whenever you come in from outside.   We can add a steroid nasal spray if needed to control your allergies.   I advise you not to resume Actos unless Dr. Renae Fickle is willing to prescribe it (referral under way)  We will call you with the results of your labs today

## 2012-11-05 NOTE — Assessment & Plan Note (Signed)
Elevated today, but home bps have been < 140.  No changes today

## 2012-11-05 NOTE — Progress Notes (Signed)
Patient ID: Edwin Evans, male   DOB: May 04, 1940, 72 y.o.   MRN: [id number]  Patient Active Problem List   Diagnosis Date Noted  . Screening for colon cancer 07/28/2012  . Hyperlipidemia   . Chest pain at rest 12/09/2011  . Diabetes mellitus type 2, uncontrolled   . Hypertension   . Coronary artery disease   . Pacemaker   . Gastroesophageal reflux disease with hiatal hernia     Subjective:  CC:   Chief Complaint  Patient presents with  . Follow-up    Allergies, and GI consult to discuss gi medication.    HPI:   Edwin Evans a 72 y.o. male who presents  Past Medical History  Diagnosis Date  . Diabetes mellitus   . Hypertension   . Coronary artery disease 2003    Noncritical stenoses by prior catheterization  . 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 GERD (gastroesophageal reflux disease)   . Gastroesophageal reflux disease with hiatal hernia   . Hyperlipidemia     Past Surgical History  Procedure Laterality Date  . Cholecystectomy    . Pacemaker placement         The following portions of the patient's history were reviewed and updated as appropriate: Allergies, current medications, and problem list.    Review of Systems:   12 Pt  review of systems was negative except those addressed in the HPI,     History   Social History  . Marital Status: Married    Spouse Name: N/A    Number of Children: N/A  . Years of Education: N/A   Occupational History  . Not on file.   Social History Main Topics  . Smoking status: Never Smoker   . Smokeless tobacco: Never Used  . Alcohol Use: Yes  . Drug Use: No  . Sexually Active: Not on file   Other Topics Concern  . Not on file   Social History Narrative  . No narrative on file    Objective:  BP 158/82  Pulse 84  Temp(Src) 98.3 F (36.8 C) (Oral)  Resp 14  Wt 182 lb 12 oz (82.895 kg)  BMI 24.12 kg/m2  SpO2 98%  General appearance: alert, cooperative and appears stated age Ears:  normal TM's and external ear canals both ears Throat: lips, mucosa, and tongue normal; teeth and gums normal Neck: no adenopathy, no carotid bruit, supple, symmetrical, trachea midline and thyroid not enlarged, symmetric, no tenderness/mass/nodules Back: symmetric, no curvature. ROM normal. No CVA tenderness. Lungs: clear to auscultation bilaterally Heart: regular rate and rhythm, S1, S2 normal, no murmur, click, rub or gallop Abdomen: soft, non-tender; bowel sounds normal; no masses,  no organomegaly Pulses: 2+ and symmetric Skin: Skin color, texture, turgor normal. No rashes or lesions Lymph nodes: Cervical, supraclavicular, and axillary nodes normal. Foot exam:  Nails are well trimmed,  No callouses,  Pulses normal, Sensation intact to microfilament  Assessment and Plan:  Gastroesophageal reflux disease with hiatal hernia 2nd opinion on hernia , referral to Dr. Alva Garnet.  patient now taking gaviscon prn ,  Not his PPI  Hypertension Elevated today, but home bps have been < 140.  No changes today  Diabetes mellitus type 2, uncontrolled To date he has been resistant to using traditional medications to manage his disease.  He has not tolerated metformin , is not interested in the newer DPP4 Inhibitors or insulin. I have advised him to stop Actos given his history of CAD, continue  glipizide and increase to 5 mg bid for cbgs > 160.  Referral to Dr[facility]le at [facility]    Updated Medication List Outpatient Encounter Prescriptions as of 11/05/2012  Medication Sig Dispense Refill  . ALPRAZolam (XANAX) 0.25 MG tablet Take 1 tablet (0.25 mg total) by mouth at bedtime as needed.  30 tablet  2  . alum hydroxide-mag trisilicate (GAVISCON) 80-20 MG CHEW Chew 2 tablets by mouth 2 (two) times daily as needed.      [name] aspirin 81 MG tablet Take 81 mg by mouth daily.      . Cholecalciferol (VITAMIN D3) 1000 UNITS CAPS Take 3,000 Units by mouth daily.       [name] CINNAMON PO Take by mouth 3 (three) times daily after  meals.      [name] co-enzyme Q-10 30 MG capsule Take 30 mg by mouth 3 (three) times daily.      . fish oil-omega-3 fatty acids 1000 MG capsule Take 2 g by mouth daily.        . hydrALAZINE (APRESOLINE) 50 MG tablet Take 1 tablet (50 mg total) by mouth 3 (three) times daily.  90 tablet  3  . losartan (COZAAR) 100 MG tablet Take 100 mg by mouth daily.        . TURMERIC PO Take 1,000 mg by mouth daily.       . vitamin B-12 (CYANOCOBALAMIN) 100 MCG tablet Take 50 mcg by mouth daily.        . [DISCONTINUED] pioglitazone (ACTOS) 15 MG tablet Take 15 mg by mouth daily.      [name] PRILOSEC OTC 20 MG tablet Take 1 tablet by mouth daily.       No facility-administered encounter medications on file as of 11/05/2012.     Orders Placed This Encounter  Procedures  . Ambulatory referral to Endocrinology  . HM DIABETES EYE EXAM  . HM DIABETES FOOT EXAM    No Follow-up on file.

## 2012-11-05 NOTE — Assessment & Plan Note (Signed)
2nd opinion on hernia , referral to Dr. Alva Garnet.  patient now taking gaviscon prn ,  Not his PPI

## 2012-11-08 ENCOUNTER — Telehealth: Payer: Self-pay | Admitting: Internal Medicine

## 2012-11-08 DIAGNOSIS — R5381 Other malaise: Secondary | ICD-10-CM

## 2012-11-08 DIAGNOSIS — Z125 Encounter for screening for malignant neoplasm of prostate: Secondary | ICD-10-CM

## 2012-11-08 DIAGNOSIS — E785 Hyperlipidemia, unspecified: Secondary | ICD-10-CM

## 2012-11-08 DIAGNOSIS — E559 Vitamin D deficiency, unspecified: Secondary | ICD-10-CM

## 2012-11-08 NOTE — Telephone Encounter (Signed)
Pt.notified

## 2012-11-08 NOTE — Telephone Encounter (Signed)
he's getting the works.

## 2012-11-08 NOTE — Addendum Note (Signed)
Addended by: Sherlene Shams on: 11/08/2012 03:09 PM   Modules accepted: Orders

## 2012-11-08 NOTE — Telephone Encounter (Signed)
Pt states he is coming in on Tuesday next week for blood work and wants to ensure he is getting a full panel.  He states he should get everything including PSA and kidney functions.  Pt states A1c was recently done.

## 2012-11-13 ENCOUNTER — Other Ambulatory Visit (INDEPENDENT_AMBULATORY_CARE_PROVIDER_SITE_OTHER): Payer: Medicare Other

## 2012-11-13 DIAGNOSIS — R5381 Other malaise: Secondary | ICD-10-CM

## 2012-11-13 DIAGNOSIS — E559 Vitamin D deficiency, unspecified: Secondary | ICD-10-CM

## 2012-11-13 DIAGNOSIS — Z125 Encounter for screening for malignant neoplasm of prostate: Secondary | ICD-10-CM

## 2012-11-13 DIAGNOSIS — E785 Hyperlipidemia, unspecified: Secondary | ICD-10-CM

## 2012-11-13 DIAGNOSIS — E1059 Type 1 diabetes mellitus with other circulatory complications: Secondary | ICD-10-CM

## 2012-11-13 DIAGNOSIS — R5383 Other fatigue: Secondary | ICD-10-CM

## 2012-11-13 LAB — LIPID PANEL
HDL: 50.3 mg/dL (ref >39.00)
Triglycerides: 48 mg/dL (ref 0.0–149.0)
VLDL: 9.6 mg/dL (ref 0.0–40.0)

## 2012-11-13 LAB — CBC WITH DIFFERENTIAL/PLATELET
Basophils Absolute: 0 10*3/uL (ref 0.0–0.1)
Eosinophils Absolute: 0.2 10*3/uL (ref 0.0–0.7)
HCT: 41.9 % (ref 39.0–52.0)
Hemoglobin: 14.2 g/dL (ref 13.0–17.0)
Lymphs Abs: 1.1 10*3/uL (ref 0.7–4.0)
MCHC: 33.9 g/dL (ref 30.0–36.0)
Monocytes Absolute: 0.3 10*3/uL (ref 0.1–1.0)
Neutro Abs: 2.5 10*3/uL (ref 1.4–7.7)
RDW: 13.5 % (ref 11.5–14.6)

## 2012-11-15 ENCOUNTER — Encounter: Payer: Self-pay | Admitting: *Deleted

## 2013-01-08 ENCOUNTER — Telehealth: Payer: Self-pay | Admitting: *Deleted

## 2013-01-08 MED ORDER — GLIPIZIDE ER 2.5 MG PO TB24: 5.0000 mg | ORAL_TABLET | Freq: Every day | ORAL | Status: DC

## 2013-01-08 NOTE — Telephone Encounter (Signed)
Refill Request  Glipizide XL 2.5 Mg tablet    #30  Take 2 tablets (5 mg total) by mouth daily   Pharmacy Note:  Wants new Rx for #60

## 2013-01-08 NOTE — Telephone Encounter (Signed)
By med list medication has not been filled since 4/14 is patient to continue?

## 2013-01-08 NOTE — Telephone Encounter (Signed)
Refilled #180 (90 day supply_

## 2013-03-29 LAB — HM DIABETES EYE EXAM

## 2013-04-23 ENCOUNTER — Encounter: Payer: Self-pay | Admitting: Internal Medicine

## 2013-04-23 ENCOUNTER — Ambulatory Visit (INDEPENDENT_AMBULATORY_CARE_PROVIDER_SITE_OTHER): Payer: Medicare Other | Admitting: Internal Medicine

## 2013-04-23 VITALS — BP 130/70 | HR 76 | Temp 99.4°F | Ht 73.0 in | Wt 181.8 lb

## 2013-04-23 DIAGNOSIS — J029 Acute pharyngitis, unspecified: Secondary | ICD-10-CM | POA: Insufficient documentation

## 2013-04-23 LAB — POCT INFLUENZA A/B
Influenza A, POC: NEGATIVE
Influenza B, POC: NEGATIVE

## 2013-04-23 MED ORDER — FLUTICASONE PROPIONATE 50 MCG/ACT NA SUSP: 2.0000 | Freq: Every day | NASAL | Status: DC

## 2013-04-23 MED ORDER — ESOMEPRAZOLE MAGNESIUM 40 MG PO CPDR: 40.0000 mg | DELAYED_RELEASE_CAPSULE | Freq: Every day | ORAL | Status: DC

## 2013-04-23 NOTE — Progress Notes (Signed)
Subjective:    Patient ID: Edwin Evans, male    DOB: [birth year], 72 y.o.   MRN: [id number]  HPI 72YO male with h/o HTN presents for acute visit. Complains of 2-3 months of sore throat off and on. Symptoms seem to be made worse by exposure to leaves during the fall and exposure to cigarette smoke. He also notes a h/o acid reflux, for which he is not on any medication. He denies abdominal pain, nausea, fever, chills, congestion. He has occasional dry cough, but no shortness of breath. He has been seen by ENT in the distant past for allergies. He has not been taking any medication for sore throat.  Outpatient Encounter Prescriptions as of 04/23/2013  Medication Sig  . ALPRAZolam (XANAX) 0.25 MG tablet Take 1 tablet (0.25 mg total) by mouth at bedtime as needed.  [name] alum hydroxide-mag trisilicate (GAVISCON) 80-20 MG CHEW Chew 2 tablets by mouth 2 (two) times daily as needed.  [name] aspirin 81 MG tablet Take 81 mg by mouth daily.  . Cholecalciferol (VITAMIN D3) 1000 UNITS CAPS Take 4,000 Units by mouth daily.   [name] CINNAMON PO Take by mouth 3 (three) times daily after meals.  . fish oil-omega-3 fatty acids 1000 MG capsule Take 2 g by mouth daily.    [name] glipiZIDE (GLUCOTROL XL) 2.5 MG 24 hr tablet Take 2 tablets (5 mg total) by mouth daily.  . hydrALAZINE (APRESOLINE) 50 MG tablet Take 1 tablet (50 mg total) by mouth 3 (three) times daily.  [name] losartan (COZAAR) 100 MG tablet Take 100 mg by mouth daily.    [name] PRILOSEC OTC 20 MG tablet Take 1 tablet by mouth daily.  . TURMERIC PO Take 1,000 mg by mouth daily.   . vitamin B-12 (CYANOCOBALAMIN) 100 MCG tablet Take 50 mcg by mouth daily.      Review of Systems  Constitutional: Negative for fever, chills, activity change, appetite change, fatigue and unexpected weight change.  HENT: Positive for sore throat and voice change (occasional hoarseness). Negative for congestion, ear pain, mouth sores, postnasal drip, rhinorrhea, sinus pressure and trouble swallowing.    Eyes: Negative for visual disturbance.  Respiratory: Positive for cough. Negative for shortness of breath.   Cardiovascular: Negative for chest pain, palpitations and leg swelling.  Gastrointestinal: Negative for abdominal pain and abdominal distention.  Genitourinary: Negative for dysuria, urgency and difficulty urinating.  Musculoskeletal: Negative for arthralgias and gait problem.  Skin: Negative for color change and rash.  Hematological: Negative for adenopathy.  Psychiatric/Behavioral: Negative for sleep disturbance and dysphoric mood. The patient is not nervous/anxious.        Objective:   Physical Exam  Constitutional: He is oriented to person, place, and time. He appears well-developed and well-nourished. No distress.  HENT:  Head: Normocephalic and atraumatic.  Right Ear: Tympanic membrane and external ear normal.  Left Ear: Tympanic membrane and external ear normal.  Nose: Nose normal. No mucosal edema or rhinorrhea.  Mouth/Throat: Mucous membranes are normal. Posterior oropharyngeal erythema (mild) present. No oropharyngeal exudate.  Eyes: Conjunctivae and EOM are normal. Pupils are equal, round, and reactive to light. Right eye exhibits no discharge. Left eye exhibits no discharge. No scleral icterus.  Neck: Normal range of motion. Neck supple. No tracheal deviation present. No thyromegaly present.  Cardiovascular: Normal rate, regular rhythm and normal heart sounds.  Exam reveals no gallop and no friction rub.   No murmur heard. Pulmonary/Chest: Effort normal and breath sounds normal. No accessory muscle usage. Not  tachypneic. No respiratory distress. He has no decreased breath sounds. He has no wheezes. He has no rhonchi. He has no rales. He exhibits no tenderness.  Musculoskeletal: Normal range of motion. He exhibits no edema.  Lymphadenopathy:    He has no cervical adenopathy.  Neurological: He is alert and oriented to person, place, and time. No cranial nerve deficit.  Coordination normal.  Skin: Skin is warm and dry. No rash noted. He is not diaphoretic. No erythema. No pallor.  Psychiatric: He has a normal mood and affect. His behavior is normal. Judgment and thought content normal.          Assessment & Plan:

## 2013-04-23 NOTE — Progress Notes (Signed)
Pre-visit discussion using our clinic review tool. No additional management support is needed unless otherwise documented below in the visit note.  

## 2013-04-23 NOTE — Patient Instructions (Signed)
Start Nexium 40mg  daily to help with acid reflux.  Start Flonase to help control allergy symptoms.  Schedule a follow up with Dr. Elenore Rota.   Follow up 2 weeks.

## 2013-04-23 NOTE — Assessment & Plan Note (Signed)
Symptoms most consistent with chronic allergic rhinitis and GERD causing chronic sore throat. Will add nasal steroid and Nexium. Encouraged him to keep follow up with ENT given persistence of symptoms, as direct visualization of his posterior pharynx and vocal cords might be helpful for further evaluation. He will start the Flonase and Nexium and then follow up here in 2 weeks or prn. Note rapid flu and strep negative today.

## 2013-04-26 ENCOUNTER — Telehealth: Payer: Self-pay | Admitting: Internal Medicine

## 2013-04-26 NOTE — Telephone Encounter (Signed)
Patient Information:  Caller Name: Kelvis  Phone: [phone]  Patient: Edwin Evans, Edwin Evans  Gender: Male  DOB: [birth year]  Age: 72 Years  PCP: Duncan Dull (Adults only)  Office Follow Up:  Does the office need to follow up with this patient?: No  Instructions For The Office: He would like an antibiotic called in.  He states he understands if it is a virus it will not work, however, he would like to try.  He is going to call the ENT and make an appt on Monday for evaluation if he is still sick.  Home care advice was given.  Stressed to pt he must take meds as prescribed/suggested and that it may take a few days for him to feel better. RN also advised if he starts running a fever, any shortness of breath, or feels he is getting worse he should go to the ER or UC to be seen.  He verbalized understanding.  RN Note:  He is still coughing up Clear Phlegm. Advised Zyrtec D.  Continue Warm salt rinses.  Also advised Ibuprofen 2 tabs every 6 hours around the clock.  Patient insists that an antibiotic be called in for him. Advised him that this is unlikely since his secretions are clear.  He is taking the Nexium and Flonase that you prescribed daily and is not noting any change.  Symptoms  Reason For Call & Symptoms: He has had a sore throat and head cold for 3 months on and off.  He had no temperature.  He was seen in the office on 04/22/2013 and had negative flu and strep.  He is concerned that he needs an antibiotic.  Reviewed Health History In EMR: Yes  Reviewed Medications In EMR: Yes  Reviewed Allergies In EMR: Yes  Reviewed Surgeries / Procedures: Yes  Date of Onset of Symptoms: 04/19/2013  Treatments Tried: Gargling Warm salt water.  Tylenol.  Treatments Tried Worked: No  Guideline(s) Used:  Sore Throat  Disposition Per Guideline:   See Today in Office  Reason For Disposition Reached:   Diabetes mellitus or weak immune system (e.g., HIV positive, cancer chemo, splenectomy, organ  transplant, chronic steroids)  Advice Given:  For Relief of Sore Throat Pain:  Sip warm chicken broth or apple juice.  Suck on hard candy or a throat lozenge (over-the-counter).  Gargle warm salt water 3 times daily (1 teaspoon of salt in 8 oz or 240 ml of warm water).  Avoid cigarette smoke.  Pain Medicines:  For pain relief, you can take either acetaminophen, ibuprofen, or naproxen.  They are over-the-counter (OTC) pain drugs. You can buy them at the drugstore.  Pain Medicines:  For pain relief, you can take either acetaminophen, ibuprofen, or naproxen.  They are over-the-counter (OTC) pain drugs. You can buy them at the drugstore.  Acetaminophen (e.g., Tylenol):  Extra Strength Tylenol: Take 1,000 mg (two 500 mg pills) every 8 hours as needed. Each Extra Strength Tylenol pill has 500 mg of acetaminophen.  Soft Diet:   Cold drinks and milk shakes are especially good (Reason: swollen tonsils can make some foods hard to swallow).  Liquids:  Adequate liquid intake is important to prevent dehydration. Drink 6-8 glasses of water per day.  Contagiousness:   You can return to work or school after the fever is gone and you feel well enough to participate in normal activities. If your doctor determines that you have Strep throat, then you will need to take an antibiotic for 24 hours before  you can return.  Expected Course:  Sore throats with viral illnesses usually last 3 or 4 days.  Call Back If:  Sore throat is the main symptom and it lasts longer than 24 hours  Sore throat is mild but lasts longer than 4 days  Fever lasts longer than 3 days  You become worse.  Patient Will Follow Care Advice:  YES

## 2013-05-02 LAB — HM DIABETES EYE EXAM

## 2013-05-08 ENCOUNTER — Telehealth: Payer: Self-pay | Admitting: Internal Medicine

## 2013-05-08 NOTE — Telephone Encounter (Signed)
PT CALLED HE SAW DR Dan HumphreysWALKER 12/23 SHE GAVE HIM SAMPLES OF NEXIUM.  HE STATED IT HAS HELPED AND WANTED TO KNOW IF HE COULD GET MORE SAMPLES. PT IS OUT OF OF SAMPLES  PT WOULD LIKE TO TALK TO SOMEONE ABOUT HIS APPOINTMENT WITH DR Marcelline MatesJENGEL OFFICE VISIT

## 2013-05-08 NOTE — Telephone Encounter (Signed)
Yes can give him samples of nexium

## 2013-05-08 NOTE — Telephone Encounter (Signed)
Okay to give samples? Also wanted to inform you of results of Dr. Marcelline MatesJengel office, stated for him to use a humidifier for chronic nasal infection that dry air was number one cause of sinus infections. Patient saw Dr, walker on 04/23/13 for cough Nexium samples given at that time.

## 2013-05-09 NOTE — Telephone Encounter (Signed)
Patient notified samples placed up front.

## 2013-05-10 ENCOUNTER — Ambulatory Visit: Payer: Medicare Other | Admitting: Internal Medicine

## 2013-05-14 ENCOUNTER — Encounter: Payer: Self-pay | Admitting: Otolaryngology

## 2013-06-02 ENCOUNTER — Encounter: Payer: Self-pay | Admitting: Otolaryngology

## 2013-06-30 ENCOUNTER — Encounter: Payer: Self-pay | Admitting: Otolaryngology

## 2013-09-24 ENCOUNTER — Other Ambulatory Visit (INDEPENDENT_AMBULATORY_CARE_PROVIDER_SITE_OTHER): Payer: Medicare Other

## 2013-09-24 ENCOUNTER — Telehealth: Payer: Self-pay | Admitting: *Deleted

## 2013-09-24 DIAGNOSIS — IMO0001 Reserved for inherently not codable concepts without codable children: Secondary | ICD-10-CM

## 2013-09-24 DIAGNOSIS — E785 Hyperlipidemia, unspecified: Secondary | ICD-10-CM

## 2013-09-24 DIAGNOSIS — IMO0002 Reserved for concepts with insufficient information to code with codable children: Secondary | ICD-10-CM

## 2013-09-24 DIAGNOSIS — E1165 Type 2 diabetes mellitus with hyperglycemia: Secondary | ICD-10-CM

## 2013-09-24 LAB — COMPREHENSIVE METABOLIC PANEL
ALK PHOS: 64 U/L (ref 39–117)
ALT: 15 U/L (ref 0–53)
AST: 17 U/L (ref 0–37)
Albumin: 3.8 g/dL (ref 3.5–5.2)
BUN: 27 mg/dL — AB (ref 6–23)
CO2: 29 mEq/L (ref 19–32)
CREATININE: 1 mg/dL (ref 0.4–1.5)
Calcium: 9.5 mg/dL (ref 8.4–10.5)
Chloride: 101 mEq/L (ref 96–112)
GFR: 81.42 mL/min (ref >60.00)
Glucose, Bld: 263 mg/dL — ABNORMAL HIGH (ref 70–99)
Potassium: 4.4 mEq/L (ref 3.5–5.1)
Sodium: 138 mEq/L (ref 135–145)
Total Bilirubin: 1.2 mg/dL (ref 0.2–1.2)
Total Protein: 6.4 g/dL (ref 6.0–8.3)

## 2013-09-24 LAB — LIPID PANEL
CHOL/HDL RATIO: 3
Cholesterol: 158 mg/dL (ref 0–200)
HDL: 50.1 mg/dL (ref >39.00)
LDL Cholesterol: 98 mg/dL (ref 0–99)
Triglycerides: 50 mg/dL (ref 0.0–149.0)
VLDL: 10 mg/dL (ref 0.0–40.0)

## 2013-09-24 LAB — HEMOGLOBIN A1C: HEMOGLOBIN A1C: 12.4 % — AB (ref 4.6–6.5)

## 2013-09-24 NOTE — Telephone Encounter (Signed)
What labs and dx?  

## 2013-09-26 ENCOUNTER — Telehealth: Payer: Self-pay | Admitting: Internal Medicine

## 2013-09-26 ENCOUNTER — Encounter: Payer: Self-pay | Admitting: Internal Medicine

## 2013-09-26 ENCOUNTER — Ambulatory Visit (INDEPENDENT_AMBULATORY_CARE_PROVIDER_SITE_OTHER): Payer: Medicare Other | Admitting: Internal Medicine

## 2013-09-26 VITALS — BP 160/78 | HR 70 | Temp 98.3°F | Resp 18 | Ht 73.0 in | Wt 170.8 lb

## 2013-09-26 DIAGNOSIS — IMO0002 Reserved for concepts with insufficient information to code with codable children: Secondary | ICD-10-CM

## 2013-09-26 DIAGNOSIS — IMO0001 Reserved for inherently not codable concepts without codable children: Secondary | ICD-10-CM | POA: Insufficient documentation

## 2013-09-26 DIAGNOSIS — R634 Abnormal weight loss: Secondary | ICD-10-CM

## 2013-09-26 DIAGNOSIS — E1165 Type 2 diabetes mellitus with hyperglycemia: Secondary | ICD-10-CM

## 2013-09-26 DIAGNOSIS — M109 Gout, unspecified: Secondary | ICD-10-CM

## 2013-09-26 DIAGNOSIS — I10 Essential (primary) hypertension: Secondary | ICD-10-CM

## 2013-09-26 DIAGNOSIS — E785 Hyperlipidemia, unspecified: Secondary | ICD-10-CM

## 2013-09-26 LAB — MICROALBUMIN / CREATININE URINE RATIO
Creatinine,U: 82.5 mg/dL
Microalb Creat Ratio: 0.6 mg/g (ref 0.0–30.0)
Microalb, Ur: 0.5 mg/dL (ref 0.0–1.9)

## 2013-09-26 LAB — URIC ACID: Uric Acid, Serum: 4.1 mg/dL (ref 4.0–7.8)

## 2013-09-26 LAB — HM DIABETES FOOT EXAM: HM Diabetic Foot Exam: NORMAL

## 2013-09-26 MED ORDER — ALPRAZOLAM 0.25 MG PO TABS: 0.2500 mg | ORAL_TABLET | Freq: Every evening | ORAL | Status: DC | PRN

## 2013-09-26 MED ORDER — ESCITALOPRAM OXALATE 10 MG PO TABS: 10.0000 mg | ORAL_TABLET | Freq: Every day | ORAL | Status: DC

## 2013-09-26 NOTE — Telephone Encounter (Signed)
The patient is wanting Alprazolam 0.25 called to the pharmacy instead of the Lexapro 10 mg . He stated that it works better it calms him right down.

## 2013-09-26 NOTE — Telephone Encounter (Signed)
Please advise 

## 2013-09-26 NOTE — Patient Instructions (Addendum)
Your diabetes is uncontrolled, but your other blood work is fine   Please talk to Dr Renae Fickle about adding NPH insulin at bedtime to get your fasting  sugars under control.   I am starting you on generic lexapro for your anxiety .  Start with 1/2 tablet daily in the evening.  Increase to full tablet as tolerated

## 2013-09-26 NOTE — Telephone Encounter (Signed)
Called in xanax as requested and patient notified.

## 2013-09-26 NOTE — Progress Notes (Signed)
Patient ID: Edwin Evans, male   DOB: 03-07-1941, 73 y.o.   MRN: [id number]    Patient Active Problem List   Diagnosis Date Noted  . Loss of weight 09/29/2013  . White coat hypertension 09/26/2013  . Screening for colon cancer 07/28/2012  . Hyperlipidemia   . Chest pain at rest 12/09/2011  . Diabetes mellitus type 2, uncontrolled   . Hypertension   . Coronary artery disease   . Pacemaker     Subjective:  CC:   Chief Complaint  Patient presents with  . Follow-up    Labs    HPI:   Edwin Evans is a 73 y.o. male who presents for Follow up on chronic conditions including hypertension   DM type 2 uncontrolled, CAD, hyperlipidemia and GERD. Last seen over July 2014.  Was referred to Dr Renae Fickle for uncontrolled DM due to mutliple drug intolerances and resistance to additional medications.  She prescribed Actos, but he is not tolerating it due to GI intolerance.  He is following a low glycemic index diet and physically very active.  He is frustrated by his inability to control his BS with diet.  His most recent A1c is  12.4 despite altering diet and taking Actos alone. Dr Renae Fickle stopped the metformin and glipizide.    He thinks  increased emotional stressors may be causing his elevated blood usuars.  His brother died a month ago, and he has been grieving his loss.   He has  lost 10 lbs in one month ,  Is working too hard on the farm   Read a book on celiac disease and thinks he has it because he has lost weight without trying.  Has not had EGD or colonscopy.  He denies diarrhea and has no No history of herpetic rash       Past Medical History  Diagnosis Date  . Diabetes mellitus   . Hypertension   . Coronary artery disease 2003    Noncritical stenoses by prior catheterization  . 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 GERD (gastroesophageal reflux disease)   . Gastroesophageal reflux disease with hiatal hernia   . Hyperlipidemia     Past Surgical History  Procedure  Laterality Date  . Cholecystectomy    . Pacemaker placement         The following portions of the patient's history were reviewed and updated as appropriate: Allergies, current medications, and problem list.    Review of Systems:   Patient denies headache, fevers, malaise, unintentional weight loss, skin rash, eye pain, sinus congestion and sinus pain, sore throat, dysphagia,  hemoptysis , cough, dyspnea, wheezing, chest pain, palpitations, orthopnea, edema, abdominal pain, nausea, melena, diarrhea, constipation, flank pain, dysuria, hematuria, urinary  Frequency, nocturia, numbness, tingling, seizures,  Focal weakness, Loss of consciousness,  Tremor, insomnia, depression, anxiety, and suicidal ideation.     History   Social History  . Marital Status: Married    Spouse Name: N/A    Number of Children: N/A  . Years of Education: N/A   Occupational History  . Not on file.   Social History Main Topics  . Smoking status: Never Smoker   . Smokeless tobacco: Never Used  . Alcohol Use: Yes  . Drug Use: No  . Sexual Activity: Not on file   Other Topics Concern  . Not on file   Social History Narrative  . No narrative on file    Objective:  Filed Vitals:  09/26/13 0939  BP: 160/78  Pulse: 70  Temp: 98.3 F (36.8 C)  Resp: 18     General appearance: alert, cooperative and appears stated age Ears: normal TM's and external ear canals both ears Throat: lips, mucosa, and tongue normal; teeth and gums normal Neck: no adenopathy, no carotid bruit, supple, symmetrical, trachea midline and thyroid not enlarged, symmetric, no tenderness/mass/nodules Back: symmetric, no curvature. ROM normal. No CVA tenderness. Lungs: clear to auscultation bilaterally Heart: regular rate and rhythm, S1, S2 normal, no murmur, click, rub or gallop Abdomen: soft, non-tender; bowel sounds normal; no masses,  no organomegaly Pulses: 2+ and symmetric Skin: Skin color, texture, turgor normal. No  rashes or lesions Lymph nodes: Cervical, supraclavicular, and axillary nodes normal.  Assessment and Plan:  Diabetes mellitus type 2, uncontrolled Now managed by Dr. Renae FicklePaul with Actos only.    To date he has been resistant to using traditional medications to manage his disease.  He has not tolerated metformin , and has been historicall opposed to using the newer DPP4 Inhibitors or insulin.Advised patient to consider insulin therapy given his a1c of 12 despite sensible diet..   Hyperlipidemia Patient continues to manage his hyperlipidemia with diet and fish oil. [name]. He is intolerant to statins.   Lab Results  Component Value Date   CHOL 158 09/24/2013   HDL 50.10 09/24/2013   LDLCALC 98 09/24/2013   LDLDIRECT 99.2 08/09/2011   TRIG 50.0 09/24/2013   CHOLHDL 3 09/24/2013     Hypertension Elevated today, but home bps have been < 140.  No changes today  Lab Results  Component Value Date   CREATININE 1.0 09/24/2013   Lab Results  Component Value Date   NA 138 09/24/2013   K 4.4 09/24/2013   CL 101 09/24/2013   CO2 29 09/24/2013     Loss of weight Screening for celiac disease is negative.  Weight loss is likely due to uncontrolled diabetes.    Updated Medication List Outpatient Encounter Prescriptions as of 09/26/2013  Medication Sig  . ALPRAZolam (XANAX) 0.25 MG tablet Take 1 tablet (0.25 mg total) by mouth at bedtime as needed.  [name]. alum hydroxide-mag trisilicate (GAVISCON) 80-20 MG CHEW Chew 2 tablets by mouth 2 (two) times daily as needed.  [name]. aspirin 81 MG tablet Take 81 mg by mouth daily.  . Cholecalciferol (VITAMIN D3) 1000 UNITS CAPS Take 4,000 Units by mouth daily.   [name]. CINNAMON PO Take by mouth 3 (three) times daily after meals.  . fish oil-omega-3 fatty acids 1000 MG capsule Take 2 g by mouth daily.    . fluticasone (FLONASE) 50 MCG/ACT nasal spray Place 2 sprays into both nostrils daily.  . hydrALAZINE (APRESOLINE) 50 MG tablet Take 1 tablet (50 mg total) by mouth 3 (three)  times daily.  [name]. losartan (COZAAR) 100 MG tablet Take 100 mg by mouth daily.    . pioglitazone (ACTOS) 30 MG tablet Take 30 mg by mouth daily.  [name]. PRILOSEC OTC 20 MG tablet Take 1 tablet by mouth daily.  . TURMERIC PO Take 1,000 mg by mouth daily.   . vitamin B-12 (CYANOCOBALAMIN) 100 MCG tablet Take 50 mcg by mouth daily.    . [DISCONTINUED] ALPRAZolam (XANAX) 0.25 MG tablet Take 1 tablet (0.25 mg total) by mouth at bedtime as needed.  [name]. escitalopram (LEXAPRO) 10 MG tablet Take 1 tablet (10 mg total) by mouth daily.  . [DISCONTINUED] esomeprazole (NEXIUM) 40 MG capsule Take 1 capsule (40 mg total) by mouth daily.  . [  DISCONTINUED] glipiZIDE (GLUCOTROL XL) 2.5 MG 24 hr tablet Take 2 tablets (5 mg total) by mouth daily.     Orders Placed This Encounter  Procedures  . Celiac Disease Ab Screen w/Rfx  . C-peptide  . Uric acid  . Microalbumin / creatinine urine ratio  . HM DIABETES EYE EXAM  . HM DIABETES FOOT EXAM    Return in about 4 weeks (around 10/24/2013).

## 2013-09-26 NOTE — Telephone Encounter (Signed)
Ok to refill,  printed Rx . I recommend both , since the lexapro was already sent and will take 2 weeks to start working

## 2013-09-26 NOTE — Progress Notes (Signed)
Pre-visit discussion using our clinic review tool. No additional management support is needed unless otherwise documented below in the visit note.  

## 2013-09-27 ENCOUNTER — Telehealth: Payer: Self-pay | Admitting: Internal Medicine

## 2013-09-27 LAB — C-PEPTIDE: C-Peptide: 0.97 ng/mL (ref 0.80–3.90)

## 2013-09-27 LAB — CELIAC DISEASE AB SCREEN W/RFX
Antigliadin Abs, IgA: 3 units (ref 0–19)
IGA/IMMUNOGLOBULIN A, SERUM: 135 mg/dL (ref 91–414)
Transglutaminase IgA: <2 U/mL (ref 0–3)

## 2013-09-27 NOTE — Telephone Encounter (Signed)
Relevant patient education assigned to patient using Emmi. ° °

## 2013-09-29 DIAGNOSIS — R634 Abnormal weight loss: Secondary | ICD-10-CM | POA: Insufficient documentation

## 2013-09-29 NOTE — Assessment & Plan Note (Signed)
Screening for celiac disease is negative.  Weight loss is likely due to uncontrolled diabetes.

## 2013-09-29 NOTE — Assessment & Plan Note (Signed)
Patient continues to manage his hyperlipidemia with diet and fish oil. [name] He is intolerant to statins.   Lab Results  Component Value Date   CHOL 158 09/24/2013   HDL 50.10 09/24/2013   LDLCALC 98 09/24/2013   LDLDIRECT 99.2 08/09/2011   TRIG 50.0 09/24/2013   CHOLHDL 3 09/24/2013

## 2013-09-29 NOTE — Assessment & Plan Note (Signed)
Now managed by Dr. Renae Fickle with Actos only.    To date he has been resistant to using traditional medications to manage his disease.  He has not tolerated metformin , and has been historicall opposed to using the newer DPP4 Inhibitors or insulin.Advised patient to consider insulin therapy given his a1c of 12 despite sensible diet.[name]

## 2013-09-29 NOTE — Assessment & Plan Note (Signed)
Elevated today, but home bps have been < 140.  No changes today  Lab Results  Component Value Date   CREATININE 1.0 09/24/2013   Lab Results  Component Value Date   NA 138 09/24/2013   K 4.4 09/24/2013   CL 101 09/24/2013   CO2 29 09/24/2013

## 2013-10-10 LAB — HEMOGLOBIN A1C: Hgb A1c MFr Bld: 11.7 % — AB (ref 4.0–6.0)

## 2013-10-17 ENCOUNTER — Encounter: Payer: Self-pay | Admitting: Otolaryngology

## 2013-10-27 ENCOUNTER — Encounter: Payer: Self-pay | Admitting: Internal Medicine

## 2013-10-27 DIAGNOSIS — E1165 Type 2 diabetes mellitus with hyperglycemia: Secondary | ICD-10-CM

## 2013-10-27 DIAGNOSIS — IMO0002 Reserved for concepts with insufficient information to code with codable children: Secondary | ICD-10-CM

## 2013-10-27 DIAGNOSIS — Z9114 Patient's other noncompliance with medication regimen: Secondary | ICD-10-CM | POA: Insufficient documentation

## 2013-10-27 DIAGNOSIS — Z91148 Patient's other noncompliance with medication regimen for other reason: Secondary | ICD-10-CM | POA: Insufficient documentation

## 2013-10-27 MED ORDER — INSULIN DETEMIR 100 UNIT/ML ~~LOC~~ SOLN: 5.0000 [IU] | Freq: Every day | SUBCUTANEOUS | Status: AC

## 2013-10-30 ENCOUNTER — Encounter: Payer: Self-pay | Admitting: Otolaryngology

## 2013-10-30 ENCOUNTER — Encounter: Payer: Self-pay | Admitting: Internal Medicine

## 2013-10-30 ENCOUNTER — Ambulatory Visit (INDEPENDENT_AMBULATORY_CARE_PROVIDER_SITE_OTHER): Payer: Medicare Other | Admitting: Internal Medicine

## 2013-10-30 VITALS — BP 132/74 | HR 56 | Temp 98.3°F | Resp 16 | Wt 174.8 lb

## 2013-10-30 DIAGNOSIS — IMO0001 Reserved for inherently not codable concepts without codable children: Secondary | ICD-10-CM

## 2013-10-30 DIAGNOSIS — E785 Hyperlipidemia, unspecified: Secondary | ICD-10-CM

## 2013-10-30 DIAGNOSIS — IMO0002 Reserved for concepts with insufficient information to code with codable children: Secondary | ICD-10-CM

## 2013-10-30 DIAGNOSIS — I10 Essential (primary) hypertension: Secondary | ICD-10-CM

## 2013-10-30 DIAGNOSIS — E1165 Type 2 diabetes mellitus with hyperglycemia: Secondary | ICD-10-CM

## 2013-10-30 DIAGNOSIS — E559 Vitamin D deficiency, unspecified: Secondary | ICD-10-CM

## 2013-10-30 DIAGNOSIS — K589 Irritable bowel syndrome without diarrhea: Secondary | ICD-10-CM

## 2013-10-30 DIAGNOSIS — Z125 Encounter for screening for malignant neoplasm of prostate: Secondary | ICD-10-CM

## 2013-10-30 DIAGNOSIS — Z79899 Other long term (current) drug therapy: Secondary | ICD-10-CM

## 2013-10-30 MED ORDER — DICYCLOMINE HCL 20 MG PO TABS: 20.0000 mg | ORAL_TABLET | Freq: Three times a day (TID) | ORAL | Status: DC

## 2013-10-30 NOTE — Progress Notes (Signed)
Pre visit review using our clinic review tool, if applicable. No additional management support is needed unless otherwise documented below in the visit note. 

## 2013-10-30 NOTE — Progress Notes (Signed)
Patient ID: Edwin Evans, male   DOB: May 06, 1940, 73 y.o.   MRN: [id number]   Patient Active Problem List   Diagnosis Date Noted  . Irritable bowel syndrome 10/30/2013  . Noncompliance with medication treatment due to underuse of medication 10/27/2013  . Loss of weight 09/29/2013  . White coat hypertension 09/26/2013  . Screening for colon cancer 07/28/2012  . Hyperlipidemia   . Chest pain at rest 12/09/2011  . Diabetes mellitus type 2, uncontrolled   . Hypertension   . Coronary artery disease   . Pacemaker     Subjective:  CC:   No chief complaint on file.   HPI:   Edwin Evans is a 73 y.o. male who presents for Follow up on weight loss and uncontrolled dm..,  Uncontrolled anxiety. Secondary to brother's death.  He still feels that the actos is bothering him despite stopping the Actos months ago Never started the lexapro or the alprazolam.   On Levemir now.  6 units daily.  After 2 weeks his bs started to improve  And this morning fasting was 134, Next a1c  Is ue in two weeks at Dr Sammuel Hines office.   Exercising regularly in the evening,  sleeping better,  Taking the lexapro    BP improved,  Attributes it to addition of 2 tbs twice daily of  vinegar  To water.   Thinks it is eating up the calcium in his joints,  "I can't take calcium channel blockers"  Met Gen Surg Dr Hassell Done at [facility].  Wants him to do an endoscopy  Has chronic post prandial  abd pain suggestive if IBS  With prior negative EGD at [place] 12 yrs ago,  Had GB out  ,  No change.  no prior trial of bentyl ,     Past Medical History  Diagnosis Date  . Diabetes mellitus   . Hypertension   . Coronary artery disease 2003    Noncritical stenoses by prior catheterization  . 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 GERD (gastroesophageal reflux disease)   . Gastroesophageal reflux disease with hiatal hernia   . Hyperlipidemia     Past Surgical History  Procedure Laterality Date  . Cholecystectomy    .  Pacemaker placement         The following portions of the patient's history were reviewed and updated as appropriate: Allergies, current medications, and problem list.    Review of Systems:   Patient denies headache, fevers, malaise, unintentional weight loss, skin rash, eye pain, sinus congestion and sinus pain, sore throat, dysphagia,  hemoptysis , cough, dyspnea, wheezing, chest pain, palpitations, orthopnea, edema, abdominal pain, nausea, melena, diarrhea, constipation, flank pain, dysuria, hematuria, urinary  Frequency, nocturia, numbness, tingling, seizures,  Focal weakness, Loss of consciousness,  Tremor, insomnia, depression, anxiety, and suicidal ideation.     History   Social History  . Marital Status: Married    Spouse Name: N/A    Number of Children: N/A  . Years of Education: N/A   Occupational History  . Not on file.   Social History Main Topics  . Smoking status: Never Smoker   . Smokeless tobacco: Never Used  . Alcohol Use: Yes  . Drug Use: No  . Sexual Activity: Not on file   Other Topics Concern  . Not on file   Social History Narrative  . No narrative on file    Objective:  Filed Vitals:   10/30/13 1341  BP: 132/74  Pulse: 56  Temp: 98.3 F (36.8 C)  Resp: 16     General appearance: alert, cooperative and appears stated age Ears: normal TM's and external ear canals both ears Throat: lips, mucosa, and tongue normal; teeth and gums normal Neck: no adenopathy, no carotid bruit, supple, symmetrical, trachea midline and thyroid not enlarged, symmetric, no tenderness/mass/nodules Back: symmetric, no curvature. ROM normal. No CVA tenderness. Lungs: clear to auscultation bilaterally Heart: regular rate and rhythm, S1, S2 normal, no murmur, click, rub or gallop Abdomen: soft, non-tender; bowel sounds normal; no masses,  no organomegaly Pulses: 2+ and symmetric Skin: Skin color, texture, turgor normal. No rashes or lesions Lymph nodes: Cervical,  supraclavicular, and axillary nodes normal.  Assessment and Plan:  Irritable bowel syndrome Suspected by history of post prandial abd pain.    Trial of bentyl.  Diabetes mellitus type 2, uncontrolled Last A1c 11.7 by Dr Eddie Dibbles June 2015 secondary to medication noncompliance.  He was started on Levemir and SSI but he declined the SSI. 4 week follow up with Dr Eddie Dibbles . He sates that his blood sugars have now improved significantly and are at goal.    Hypertension Aggravated by white coat hypertension.  .Well controlled by home readings on current regimen. Renal function stable, no changes today.   Updated Medication List Outpatient Encounter Prescriptions as of 10/30/2013  Medication Sig  . alum hydroxide-mag trisilicate (GAVISCON) 28-41 MG CHEW Chew 2 tablets by mouth 2 (two) times daily as needed.  [name] aspirin 81 MG tablet Take 81 mg by mouth daily.  . Cholecalciferol (VITAMIN D3) 1000 UNITS CAPS Take 4,000 Units by mouth daily.   [name] CINNAMON PO Take by mouth 3 (three) times daily after meals.  . dicyclomine (BENTYL) 20 MG tablet Take 1 tablet (20 mg total) by mouth 4 (four) times daily -  before meals and at bedtime.  . fish oil-omega-3 fatty acids 1000 MG capsule Take 2 g by mouth daily.    . fluticasone (FLONASE) 50 MCG/ACT nasal spray Place 2 sprays into both nostrils daily.  . hydrALAZINE (APRESOLINE) 50 MG tablet Take 1 tablet (50 mg total) by mouth 3 (three) times daily.  . insulin detemir (LEVEMIR) 100 UNIT/ML injection Inject 0.05 mLs (5 Units total) into the skin daily.  [name] losartan (COZAAR) 100 MG tablet Take 100 mg by mouth daily.    [name] PRILOSEC OTC 20 MG tablet Take 1 tablet by mouth daily.  . TURMERIC PO Take 1,000 mg by mouth daily.   . vitamin B-12 (CYANOCOBALAMIN) 100 MCG tablet Take 50 mcg by mouth daily.    . [DISCONTINUED] ALPRAZolam (XANAX) 0.25 MG tablet Take 1 tablet (0.25 mg total) by mouth at bedtime as needed.  . [DISCONTINUED] escitalopram (LEXAPRO) 10 MG tablet Take 1  tablet (10 mg total) by mouth daily.     Orders Placed This Encounter  Procedures  . Comprehensive metabolic panel  . Lipid panel  . Vit D  25 hydroxy (rtn osteoporosis monitoring)  . PSA, Medicare    No Follow-up on file.

## 2013-10-30 NOTE — Patient Instructions (Signed)
We discussed trying an antispasmodic medication for your stomach called dicyclomine Take it 20 minutes before each meal to prevent pain   Irritable Bowel Syndrome Irritable bowel syndrome (IBS) is caused by a disturbance of normal bowel function and is a common digestive disorder. You may also hear this condition called spastic colon, mucous colitis, and irritable colon. There is no cure for IBS. However, symptoms often gradually improve or disappear with a good diet, stress management, and medicine. This condition usually appears in late adolescence or early adulthood. Women develop it twice as often as men. CAUSES  After food has been digested and absorbed in the small intestine, waste material is moved into the large intestine, or colon. In the colon, water and salts are absorbed from the undigested products coming from the small intestine. The remaining residue, or fecal material, is held for elimination. Under normal circumstances, gentle, rhythmic contractions of the bowel walls push the fecal material along the colon toward the rectum. In IBS, however, these contractions are irregular and poorly coordinated. The fecal material is either retained too long, resulting in constipation, or expelled too soon, producing diarrhea. SIGNS AND SYMPTOMS  The most common symptom of IBS is abdominal pain. It is often in the lower left side of the abdomen, but it may occur anywhere in the abdomen. The pain comes from spasms of the bowel muscles happening too much and from the buildup of gas and fecal material in the colon. This pain:  Can range from sharp abdominal cramps to a dull, continuous ache.  Often worsens soon after eating.  Is often relieved by having a bowel movement or passing gas. Abdominal pain is usually accompanied by constipation, but it may also produce diarrhea. The diarrhea often occurs right after a meal or upon waking up in the morning. The stools are often soft, watery, and flecked with  mucus. Other symptoms of IBS include:  Bloating.  Loss of appetite.  Heartburn.  Backache.  Dull pain in the arms or shoulders.  Nausea.  Burping.  Vomiting.  Gas. IBS may also cause symptoms that are unrelated to the digestive system, such as:  Fatigue.  Headaches.  Anxiety.  Shortness of breath.  Trouble concentrating.  Dizziness. These symptoms tend to come and go. DIAGNOSIS  The symptoms of IBS may seem like symptoms of other, more serious digestive disorders. Your health care provider may want to perform tests to exclude these disorders.  TREATMENT Many medicines are available to help correct bowel function or relieve bowel spasms and abdominal pain. Among the medicines available are:  Laxatives for severe constipation and to help restore normal bowel habits.  Specific antidiarrheal medicines to treat severe or lasting diarrhea.  Antispasmodic agents to relieve intestinal cramps. Your health care provider may also decide to treat you with a mild tranquilizer or sedative during unusually stressful periods in your life. Your health care provider may also prescribe antidepressant medicine. The use of this medicine has been shown to reduce pain and other symptoms of IBS. Remember that if any medicine is prescribed for you, you should take it exactly as directed. Make sure your health care provider knows how well it worked for you. HOME CARE INSTRUCTIONS   Take all medicines as directed by your health care provider.  Avoid foods that are high in fat or oils, such as heavy cream, butter, frankfurters, sausage, and other fatty meats.  Avoid foods that make you go to the bathroom, such as fruit, fruit juice, and dairy  products.  Cut out carbonated drinks, chewing gum, and "gassy" foods such as beans and cabbage. This may help relieve bloating and burping.  Eat foods with bran, and drink plenty of liquids with the bran foods. This helps relieve constipation.  Keep  track of what foods seem to bring on your symptoms.  Avoid emotionally charged situations or circumstances that produce anxiety.  Start or continue exercising.  Get plenty of rest and sleep. Document Released: 04/18/2005 Document Revised: 04/23/2013 Document Reviewed: 12/07/2007 [facility] Patient Information 2015 [place], [place]. This information is not intended to replace advice given to you by your health care provider. Make sure you discuss any questions you have with your health care provider.

## 2013-11-02 NOTE — Assessment & Plan Note (Signed)
Suspected by history of post prandial abd pain.    Trial of bentyl.

## 2013-11-02 NOTE — Assessment & Plan Note (Signed)
Aggravated by white coat hypertension.  .Well controlled by home readings on current regimen. Renal function stable, no changes today.

## 2013-11-02 NOTE — Assessment & Plan Note (Addendum)
Last A1c 11.7 by Dr Renae FicklePaul June 2015 secondary to medication noncompliance.  He was started on Levemir and SSI but he declined the SSI. 4 week follow up with Dr Renae FicklePaul . He sates that his blood sugars have now improved significantly and are at goal.

## 2013-11-25 ENCOUNTER — Ambulatory Visit: Payer: Self-pay | Admitting: Gastroenterology

## 2013-11-25 LAB — HM COLONOSCOPY

## 2013-11-26 LAB — PATHOLOGY REPORT

## 2013-11-30 ENCOUNTER — Encounter: Payer: Self-pay | Admitting: Otolaryngology

## 2013-12-04 ENCOUNTER — Encounter: Payer: Self-pay | Admitting: Internal Medicine

## 2013-12-22 ENCOUNTER — Telehealth: Payer: Self-pay | Admitting: Internal Medicine

## 2013-12-22 DIAGNOSIS — K219 Gastro-esophageal reflux disease without esophagitis: Secondary | ICD-10-CM

## 2013-12-22 DIAGNOSIS — K449 Diaphragmatic hernia without obstruction or gangrene: Principal | ICD-10-CM

## 2013-12-26 ENCOUNTER — Encounter: Payer: Self-pay | Admitting: Internal Medicine

## 2014-01-07 ENCOUNTER — Other Ambulatory Visit: Payer: Self-pay | Admitting: *Deleted

## 2014-01-07 DIAGNOSIS — K589 Irritable bowel syndrome without diarrhea: Secondary | ICD-10-CM

## 2014-01-07 MED ORDER — DICYCLOMINE HCL 20 MG PO TABS: 20.0000 mg | ORAL_TABLET | Freq: Three times a day (TID) | ORAL | Status: AC

## 2014-01-08 ENCOUNTER — Ambulatory Visit (INDEPENDENT_AMBULATORY_CARE_PROVIDER_SITE_OTHER): Payer: Medicare Other | Admitting: Internal Medicine

## 2014-01-08 ENCOUNTER — Encounter: Payer: Self-pay | Admitting: Internal Medicine

## 2014-01-08 VITALS — BP 152/70 | HR 66 | Temp 97.7°F | Resp 14 | Ht 73.0 in | Wt 173.2 lb

## 2014-01-08 DIAGNOSIS — R030 Elevated blood-pressure reading, without diagnosis of hypertension: Secondary | ICD-10-CM

## 2014-01-08 DIAGNOSIS — Z9114 Patient's other noncompliance with medication regimen: Secondary | ICD-10-CM

## 2014-01-08 DIAGNOSIS — IMO0001 Reserved for inherently not codable concepts without codable children: Secondary | ICD-10-CM

## 2014-01-08 DIAGNOSIS — Z9119 Patient's noncompliance with other medical treatment and regimen: Secondary | ICD-10-CM

## 2014-01-08 DIAGNOSIS — M545 Low back pain, unspecified: Secondary | ICD-10-CM

## 2014-01-08 DIAGNOSIS — K224 Dyskinesia of esophagus: Secondary | ICD-10-CM

## 2014-01-08 DIAGNOSIS — K2960 Other gastritis without bleeding: Secondary | ICD-10-CM

## 2014-01-08 DIAGNOSIS — M546 Pain in thoracic spine: Secondary | ICD-10-CM

## 2014-01-08 DIAGNOSIS — R079 Chest pain, unspecified: Secondary | ICD-10-CM

## 2014-01-08 DIAGNOSIS — Z1211 Encounter for screening for malignant neoplasm of colon: Secondary | ICD-10-CM

## 2014-01-08 DIAGNOSIS — Z91199 Patient's noncompliance with other medical treatment and regimen due to unspecified reason: Secondary | ICD-10-CM

## 2014-01-08 MED ORDER — CYCLOBENZAPRINE HCL 10 MG PO TABS: 10.0000 mg | ORAL_TABLET | Freq: Three times a day (TID) | ORAL | Status: AC | PRN

## 2014-01-08 NOTE — Progress Notes (Signed)
Patient ID: Edwin Evans, male   DOB: 06/20/1940, 73 y.o.   MRN: [id number]   Patient Active Problem List   Diagnosis Date Noted  . Noncompliance with medication treatment due to intermittent use of medication 01/11/2014  . Noncompliance with immunization regimen 01/11/2014  . Back pain of thoracolumbar region 01/11/2014  . Esophageal dysmotility 01/11/2014  . Gastritis, bile acid reflux 01/11/2014  . Irritable bowel syndrome 10/30/2013  . Noncompliance with medication treatment due to underuse of medication 10/27/2013  . Loss of weight 09/29/2013  . White coat hypertension 09/26/2013  . Screening for colon cancer 07/28/2012  . Hyperlipidemia   . Chest pain at rest 12/09/2011  . Diabetes mellitus type 2, uncontrolled   . Hypertension   . Coronary artery disease   . Pacemaker     Subjective:  CC:   Chief Complaint  Patient presents with  . Back Pain    x 2-3 months, does not hurt upon waking, but hurts as moves througout the day    HPI:   Edwin Evans is a 73 y.o. male who presents for Activity induced back pain for the past 3 months.   Patient states that he strained his left hip in March after doing some from heavy lifting [place] his [place].  He initially saw a chiropractor and was teated for hip pain and muscle spasm which resolved after the first visit, but returned with increased pain after his second visit.  He is waking up in the AM with minor stiffness  In his back and hip but no pain .  He has been doing daily back exercises which is helping and doing leg lifts to strengthen his abdominal muscles.. His paij is aggravated by prlonged sitting and prolonged stooping,  There is no radiation .  He would like to try a muscle relaxer    Past Medical History  Diagnosis Date  . Diabetes mellitus   . Hypertension   . Coronary artery disease 2003    Noncritical stenoses by prior catheterization  . 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 GERD (gastroesophageal reflux  disease)   . Gastroesophageal reflux disease with hiatal hernia   . Hyperlipidemia     Past Surgical History  Procedure Laterality Date  . Cholecystectomy    . Pacemaker placement         The following portions of the patient's history were reviewed and updated as appropriate: Allergies, current medications, and problem list.    Review of Systems:   Patient denies headache, fevers, malaise, unintentional weight loss, skin rash, eye pain, sinus congestion and sinus pain, sore throat, dysphagia,  hemoptysis , cough, dyspnea, wheezing, chest pain, palpitations, orthopnea, edema, abdominal pain, nausea, melena, diarrhea, constipation, flank pain, dysuria, hematuria, urinary  Frequency, nocturia, numbness, tingling, seizures,  Focal weakness, Loss of consciousness,  Tremor, insomnia, depression, anxiety, and suicidal ideation.     History   Social History  . Marital Status: Married    Spouse Name: N/A    Number of Children: N/A  . Years of Education: N/A   Occupational History  . Not on file.   Social History Main Topics  . Smoking status: Never Smoker   . Smokeless tobacco: Never Used  . Alcohol Use: Yes  . Drug Use: No  . Sexual Activity: Not on file   Other Topics Concern  . Not on file   Social History Narrative  . No narrative on file    Objective:  Filed  Vitals:   01/08/14 1041  BP: 152/70  Pulse: 66  Temp: 97.7 F (36.5 C)  Resp: 14     General appearance: alert, cooperative and appears stated age Ears: normal TM's and external ear canals both ears Throat: lips, mucosa, and tongue normal; teeth and gums normal Neck: no adenopathy, no carotid bruit, supple, symmetrical, trachea midline and thyroid not enlarged, symmetric, no tenderness/mass/nodules Back: symmetric, no curvature. ROM normal. No CVA tenderness. Lungs: clear to auscultation bilaterally Heart: regular rate and rhythm, S1, S2 normal, no murmur, click, rub or gallop Abdomen: soft,  non-tender; bowel sounds normal; no masses,  no organomegaly Pulses: 2+ and symmetric Skin: Skin color, texture, turgor normal. No rashes or lesions Lymph nodes: Cervical, supraclavicular, and axillary nodes normal.  Assessment and Plan:  White coat hypertension He is intolerant to three classes of medications and is resistant to adding more medication  Because he insists that his BP is better controlled at home and averages 140//80 or less when he checks it.   Noncompliance with immunization regimen Patient continues to decline all recommended immunizations despite advice   Screening for colon cancer He finally had a repeat colonoscopy in July 2015.  No polyps were found , only tics and hemorrhoids.   Chest pain at rest His chest pain continues to be problematic and is attributed to esophageal dyskinesia and spasticity noted on July 2015 EGD .  Adding sucralfate  Back pain of thoracolumbar region His exam is normal,  Agree with trial of muscle relaxer and use of NSAIDs prn   Gastritis, bile acid reflux By EGD July 2015. Protonix 40 mg daily prescribed, but he prefers to continue prilosec 20 mg daily.  Adding sucralfate today    Updated Medication List Outpatient Encounter Prescriptions as of 01/08/2014  Medication Sig  . alum hydroxide-mag trisilicate (GAVISCON) 80-20 MG CHEW Chew 2 tablets by mouth 2 (two) times daily as needed.  [name] aspirin 81 MG tablet Take 81 mg by mouth daily.  . Cholecalciferol (VITAMIN D3) 1000 UNITS CAPS Take 4,000 Units by mouth daily.   [name] CINNAMON PO Take by mouth 3 (three) times daily after meals.  . dicyclomine (BENTYL) 20 MG tablet Take 1 tablet (20 mg total) by mouth 4 (four) times daily -  before meals and at bedtime.  . fish oil-omega-3 fatty acids 1000 MG capsule Take 2 g by mouth daily.    . fluticasone (FLONASE) 50 MCG/ACT nasal spray Place 2 sprays into both nostrils daily as needed.  . hydrALAZINE (APRESOLINE) 50 MG tablet Take 1 tablet (50 mg  total) by mouth 3 (three) times daily.  . insulin detemir (LEVEMIR) 100 UNIT/ML injection Inject 0.05 mLs (5 Units total) into the skin daily.  [name] losartan (COZAAR) 100 MG tablet Take 100 mg by mouth daily.    [name] PRILOSEC OTC 20 MG tablet Take 1 tablet by mouth daily.  . sucralfate (CARAFATE) 1 G tablet Take 1 g by mouth 3 (three) times daily.   . TURMERIC PO Take 1,000 mg by mouth daily.   . vitamin B-12 (CYANOCOBALAMIN) 100 MCG tablet Take 50 mcg by mouth daily.    . [DISCONTINUED] fluticasone (FLONASE) 50 MCG/ACT nasal spray Place 2 sprays into both nostrils daily.  . cyclobenzaprine (FLEXERIL) 10 MG tablet Take 1 tablet (10 mg total) by mouth 3 (three) times daily as needed for muscle spasms.     No orders of the defined types were placed in this encounter.    No Follow-up on  file.

## 2014-01-08 NOTE — Progress Notes (Signed)
Pre visit review using our clinic review tool, if applicable. No additional management support is needed unless otherwise documented below in the visit note. 

## 2014-01-08 NOTE — Patient Instructions (Signed)
Remember to take sucralfate two hours before your other meds or 1 hour after your other meds   I have called in flexeril ,  A muscle relaxer for your back  If your pain persists,  We should  X ray your thoracic spine and lumbar spine

## 2014-01-11 ENCOUNTER — Encounter: Payer: Self-pay | Admitting: Internal Medicine

## 2014-01-11 DIAGNOSIS — Z91199 Patient's noncompliance with other medical treatment and regimen due to unspecified reason: Secondary | ICD-10-CM | POA: Insufficient documentation

## 2014-01-11 DIAGNOSIS — Z91148 Patient's other noncompliance with medication regimen for other reason: Secondary | ICD-10-CM | POA: Insufficient documentation

## 2014-01-11 DIAGNOSIS — K2960 Other gastritis without bleeding: Secondary | ICD-10-CM | POA: Insufficient documentation

## 2014-01-11 DIAGNOSIS — Z9114 Patient's other noncompliance with medication regimen: Secondary | ICD-10-CM | POA: Insufficient documentation

## 2014-01-11 DIAGNOSIS — M47816 Spondylosis without myelopathy or radiculopathy, lumbar region: Secondary | ICD-10-CM | POA: Insufficient documentation

## 2014-01-11 DIAGNOSIS — K224 Dyskinesia of esophagus: Secondary | ICD-10-CM | POA: Insufficient documentation

## 2014-01-11 DIAGNOSIS — Z9119 Patient's noncompliance with other medical treatment and regimen: Secondary | ICD-10-CM | POA: Insufficient documentation

## 2014-01-11 NOTE — Assessment & Plan Note (Addendum)
By EGD July 2015. Protonix 40 mg daily prescribed, but he prefers to continue prilosec 20 mg daily.  Adding sucralfate today

## 2014-01-11 NOTE — Assessment & Plan Note (Addendum)
He finally had a repeat colonoscopy in July 2015.  No polyps were found , only tics and hemorrhoids.

## 2014-01-11 NOTE — Assessment & Plan Note (Signed)
His exam is normal,  Agree with trial of muscle relaxer and use of NSAIDs prn

## 2014-01-11 NOTE — Assessment & Plan Note (Signed)
Patient continues to decline all recommended immunizations despite advice

## 2014-01-11 NOTE — Assessment & Plan Note (Signed)
He is intolerant to three classes of medications and is resistant to adding more medication  Because he insists that his BP is better controlled at home and averages 140//80 or less when he checks it.

## 2014-01-11 NOTE — Assessment & Plan Note (Addendum)
His chest pain continues to be problematic and is attributed to esophageal dyskinesia and spasticity noted on July 2015 EGD .  Adding sucralfate

## 2014-01-20 ENCOUNTER — Encounter: Payer: Self-pay | Admitting: Internal Medicine

## 2014-01-20 ENCOUNTER — Ambulatory Visit (INDEPENDENT_AMBULATORY_CARE_PROVIDER_SITE_OTHER): Payer: Medicare Other | Admitting: Internal Medicine

## 2014-01-20 VITALS — BP 140/72 | HR 60 | Temp 98.1°F | Resp 16 | Ht 73.0 in | Wt 173.8 lb

## 2014-01-20 DIAGNOSIS — G8929 Other chronic pain: Secondary | ICD-10-CM

## 2014-01-20 DIAGNOSIS — M546 Pain in thoracic spine: Secondary | ICD-10-CM

## 2014-01-20 DIAGNOSIS — R1013 Epigastric pain: Secondary | ICD-10-CM

## 2014-01-20 DIAGNOSIS — K449 Diaphragmatic hernia without obstruction or gangrene: Secondary | ICD-10-CM

## 2014-01-20 DIAGNOSIS — M549 Dorsalgia, unspecified: Secondary | ICD-10-CM

## 2014-01-20 NOTE — Patient Instructions (Addendum)
Your back pain might be coming from an aortic aneurysm since it is not the typical presentation for sciatic   I am ordering an ultrasound of your aorta to be sure you do not have one  If it is normal.  I will  Order a CT of the abdomen and pelvis `

## 2014-01-20 NOTE — Progress Notes (Signed)
Patient ID: Edwin Evans, male   DOB: 1940/08/27, 73 y.o.   MRN: [id number]  Patient Active Problem List   Diagnosis Date Noted  . Sliding hiatal hernia 01/21/2014  . Back pain without radiation 01/21/2014  . Noncompliance with medication treatment due to intermittent use of medication 01/11/2014  . Noncompliance with immunization regimen 01/11/2014  . Back pain of thoracolumbar region 01/11/2014  . Esophageal dysmotility 01/11/2014  . Gastritis, bile acid reflux 01/11/2014  . Irritable bowel syndrome 10/30/2013  . Noncompliance with medication treatment due to underuse of medication 10/27/2013  . Loss of weight 09/29/2013  . White coat hypertension 09/26/2013  . Screening for colon cancer 07/28/2012  . Hyperlipidemia   . Chest pain at rest 12/09/2011  . Diabetes mellitus type 2, uncontrolled   . Hypertension   . Coronary artery disease   . Pacemaker     Subjective:  CC:   Chief Complaint  Patient presents with  . Back Pain    lowerback pain on left side radiates up spine cord and feels like he is having muscle spasms. patient staed that the muscle relaxer has not helped.    HPI:   Edwin Evans is a 73 y.o. male who presents for Persistent mid to low back pain.  Patient requested a  Muscle relaxer last week for presumed back pain secondary to muscle spasm.  Did not have relief and did not tolerate continued use,  Returns today for further evaluation.  States the the pain is not present in the early am but becomes problematic once he eats eats.  He points to L1 position and states that the pain is on both sides of spine , doesn not radiate to either leg or groin,  But spreads proximally  to lower thoracic area.   HE has a  history of bile gastritis by recent GI evaluation as well as a small sliding hiatal hernia and esophageal dysmotility .  No prior evaluation for aortic aneurysm despite history of uncontrolled hypertension and CAD.,      Past Medical History  Diagnosis Date   . Diabetes mellitus   . Hypertension   . Coronary artery disease 2003    Noncritical stenoses by prior catheterization  . 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 GERD (gastroesophageal reflux disease)   . Gastroesophageal reflux disease with hiatal hernia   . Hyperlipidemia     Past Surgical History  Procedure Laterality Date  . Cholecystectomy    . Pacemaker placement         The following portions of the patient's history were reviewed and updated as appropriate: Allergies, current medications, and problem list.    Review of Systems:   Patient denies headache, fevers, malaise, unintentional weight loss, skin rash, eye pain, sinus congestion and sinus pain, sore throat, dysphagia,  hemoptysis , cough, dyspnea, wheezing, chest pain, palpitations, orthopnea, edema, abdominal pain, nausea, melena, diarrhea, constipation, flank pain, dysuria, hematuria, urinary  Frequency, nocturia, numbness, tingling, seizures,  Focal weakness, Loss of consciousness,  Tremor, insomnia, depression, anxiety, and suicidal ideation.     History   Social History  . Marital Status: Married    Spouse Name: N/A    Number of Children: N/A  . Years of Education: N/A   Occupational History  . Not on file.   Social History Main Topics  . Smoking status: Never Smoker   . Smokeless tobacco: Never Used  . Alcohol Use: Yes  . Drug Use: No  .  Sexual Activity: Not on file   Other Topics Concern  . Not on file   Social History Narrative  . No narrative on file    Objective:  Filed Vitals:   01/20/14 1520  BP: 140/72  Pulse: 60  Temp: 98.1 F (36.7 C)  Resp: 16     General appearance: alert, cooperative and appears stated age Ears: normal TM's and external ear canals both ears Throat: lips, mucosa, and tongue normal; teeth and gums normal Neck: no adenopathy, no carotid bruit, supple, symmetrical, trachea midline and thyroid not enlarged, symmetric, no  tenderness/mass/nodules Back: symmetric, no curvature. ROM normal. No CVA tenderness. Lungs: clear to auscultation bilaterally Heart: regular rate and rhythm, S1, S2 normal, no murmur, click, rub or gallop Abdomen: soft, non-tender; bowel sounds normal; pulsatile abdomen,   no organomegaly Pulses: 2+ and symmetric Skin: Skin color, texture, turgor normal. No rashes or lesions Lymph nodes: Cervical, supraclavicular, and axillary nodes normal. Neuro:  Negative straight leg lift.   Assessment and Plan:  Sliding hiatal hernia Small seen on upper GI study done in June 2014 by Dimas Aguas., patient continues to attribute all episode of chest pain to GI sources and has apparently found a Water engineer who has socially  offered to surgically correct the hernia at some point.   Back pain without radiation His back pain is not typical for sciatica and raises concern of aortic aneurysm given his exam which does note a pulsatile mid epigastric mass and his  history of post prandial pain and  uncontrolled hypertension .  I have ordered an ultrasound of the aorta.     Updated Medication List Outpatient Encounter Prescriptions as of 01/20/2014  Medication Sig  . alum hydroxide-mag trisilicate (GAVISCON) 80-20 MG CHEW Chew 2 tablets by mouth 2 (two) times daily as needed.  [name] aspirin 81 MG tablet Take 81 mg by mouth daily.  . Cholecalciferol (VITAMIN D3) 1000 UNITS CAPS Take 4,000 Units by mouth daily.   [name] CINNAMON PO Take by mouth 3 (three) times daily after meals.  . dicyclomine (BENTYL) 20 MG tablet Take 1 tablet (20 mg total) by mouth 4 (four) times daily -  before meals and at bedtime.  . fish oil-omega-3 fatty acids 1000 MG capsule Take 2 g by mouth daily.    . fluticasone (FLONASE) 50 MCG/ACT nasal spray Place 2 sprays into both nostrils daily as needed.  . hydrALAZINE (APRESOLINE) 50 MG tablet Take 1 tablet (50 mg total) by mouth 3 (three) times daily.  . insulin detemir (LEVEMIR) 100  UNIT/ML injection Inject 0.05 mLs (5 Units total) into the skin daily.  . Insulin Regular Human (AFREZZA) 4 UNITS POWD Inhale 1 application into the lungs 2 (two) times daily.  [name] losartan (COZAAR) 100 MG tablet Take 100 mg by mouth daily.    . sucralfate (CARAFATE) 1 G tablet Take 1 g by mouth 3 (three) times daily.   . TURMERIC PO Take 1,000 mg by mouth daily.   . vitamin B-12 (CYANOCOBALAMIN) 100 MCG tablet Take 50 mcg by mouth daily.    . cyclobenzaprine (FLEXERIL) 10 MG tablet Take 1 tablet (10 mg total) by mouth 3 (three) times daily as needed for muscle spasms.  [name] PRILOSEC OTC 20 MG tablet Take 1 tablet by mouth daily.     Orders Placed This Encounter  Procedures  . US Aorta    No Follow-up on file.

## 2014-01-20 NOTE — Progress Notes (Signed)
Pre-visit discussion using our clinic review tool. No additional management support is needed unless otherwise documented below in the visit note.  

## 2014-01-21 ENCOUNTER — Telehealth: Payer: Self-pay

## 2014-01-21 DIAGNOSIS — M549 Dorsalgia, unspecified: Secondary | ICD-10-CM | POA: Insufficient documentation

## 2014-01-21 DIAGNOSIS — K449 Diaphragmatic hernia without obstruction or gangrene: Secondary | ICD-10-CM | POA: Insufficient documentation

## 2014-01-21 NOTE — Assessment & Plan Note (Signed)
Small seen on upper GI study done in June 2014 by Dimas Aguas., patient continues to attribute all episode of chest pain to GI sources and has apparently found a Water engineer who has socially  offered to surgically correct the hernia at some point.

## 2014-01-21 NOTE — Telephone Encounter (Signed)
Radiology from [facility] called and asked if the Aorta US order could be changed to - cpt [phone]

## 2014-01-21 NOTE — Telephone Encounter (Signed)
Please advise 

## 2014-01-21 NOTE — Telephone Encounter (Signed)
Not sure what a CPT [id number] is,  i can't order it in EPIC that way, but if it gets me an ultrasounf of the abdominal aorta ,  fine

## 2014-01-21 NOTE — Assessment & Plan Note (Signed)
His back pain is not typical for sciatica and raises concern of aortic aneurysm given his exam which does note a pulsatile mid epigastric mass and his  history of post prandial pain and  uncontrolled hypertension .  I have ordered an ultrasound of the aorta.

## 2014-01-22 ENCOUNTER — Ambulatory Visit: Payer: Self-pay | Admitting: Internal Medicine

## 2014-01-24 ENCOUNTER — Telehealth: Payer: Self-pay | Admitting: Internal Medicine

## 2014-01-24 DIAGNOSIS — M545 Low back pain, unspecified: Secondary | ICD-10-CM

## 2014-01-24 DIAGNOSIS — M25559 Pain in unspecified hip: Secondary | ICD-10-CM

## 2014-01-24 NOTE — Telephone Encounter (Signed)
Ct scan not ecessary at this point need plain films of lumbar spine and hips first.  I will order, at [facility] for Edwin Evans

## 2014-01-24 NOTE — Telephone Encounter (Signed)
The ultrasoumd was completely normal.  No aneurysm, and the kidneys were normal size

## 2014-01-24 NOTE — Telephone Encounter (Signed)
Patient as ka bout a CT scan that was to be done on his lower back and hips.  Patient advised of [place] results and voiced understanding.

## 2014-01-27 NOTE — Telephone Encounter (Signed)
Patient notified and will go on Tuesday.

## 2014-01-28 ENCOUNTER — Ambulatory Visit (INDEPENDENT_AMBULATORY_CARE_PROVIDER_SITE_OTHER)
Admission: RE | Admit: 2014-01-28 | Discharge: 2014-01-28 | Disposition: A | Discharge status: Home / Self Care | Payer: Medicare Other | Source: Ambulatory Visit | Attending: Internal Medicine | Admitting: Internal Medicine

## 2014-01-28 ENCOUNTER — Telehealth: Payer: Self-pay | Admitting: Internal Medicine

## 2014-01-28 DIAGNOSIS — M25559 Pain in unspecified hip: Secondary | ICD-10-CM

## 2014-01-28 DIAGNOSIS — M545 Low back pain, unspecified: Secondary | ICD-10-CM

## 2014-01-28 DIAGNOSIS — M549 Dorsalgia, unspecified: Secondary | ICD-10-CM

## 2014-01-28 MED ORDER — LACTULOSE 20 GM/30ML PO SOLN: 30.0000 mL | Freq: Four times a day (QID) | ORAL | Status: AC | PRN

## 2014-01-28 NOTE — Assessment & Plan Note (Signed)
No aneurysm,  But lots of degenerative changes and constipation by  X rays

## 2014-02-21 ENCOUNTER — Encounter: Payer: Self-pay | Admitting: Internal Medicine

## 2014-02-24 ENCOUNTER — Encounter: Payer: Self-pay | Admitting: Internal Medicine

## 2014-02-24 ENCOUNTER — Ambulatory Visit (INDEPENDENT_AMBULATORY_CARE_PROVIDER_SITE_OTHER): Payer: Medicare Other | Admitting: Internal Medicine

## 2014-02-24 VITALS — BP 160/70 | HR 53 | Temp 98.0°F | Resp 16 | Ht 73.0 in | Wt 172.5 lb

## 2014-02-24 DIAGNOSIS — Z9119 Patient's noncompliance with other medical treatment and regimen: Secondary | ICD-10-CM

## 2014-02-24 DIAGNOSIS — Z91199 Patient's noncompliance with other medical treatment and regimen due to unspecified reason: Secondary | ICD-10-CM

## 2014-02-24 DIAGNOSIS — K5730 Diverticulosis of large intestine without perforation or abscess without bleeding: Secondary | ICD-10-CM

## 2014-02-24 DIAGNOSIS — M545 Low back pain: Secondary | ICD-10-CM

## 2014-02-24 DIAGNOSIS — M47816 Spondylosis without myelopathy or radiculopathy, lumbar region: Secondary | ICD-10-CM

## 2014-02-24 DIAGNOSIS — M5136 Other intervertebral disc degeneration, lumbar region: Secondary | ICD-10-CM

## 2014-02-24 DIAGNOSIS — Z1211 Encounter for screening for malignant neoplasm of colon: Secondary | ICD-10-CM

## 2014-02-24 NOTE — Patient Instructions (Signed)
We are ordering CT of your lumbar spine to rule out spinal stenosis caused by scoliosis and degenerative changes that were seen on your x rays   You can also try wearing Salon pas patches over your painful joints for 8 hours of relief ur

## 2014-02-24 NOTE — Progress Notes (Signed)
Patient ID: Edwin MayersDavid N Evans, male   DOB: [birth year], 73 y.o.   MRN: [id number]   Patient Active Problem List   Diagnosis Date Noted  . Diverticulosis of colon without hemorrhage 02/25/2014  . Sliding hiatal hernia 01/21/2014  . Back pain without radiation 01/21/2014  . Noncompliance with medication treatment due to intermittent use of medication 01/11/2014  . Noncompliance with immunization regimen 01/11/2014  . Degenerative joint disease (DJD) of lumbar spine 01/11/2014  . Esophageal dysmotility 01/11/2014  . Gastritis, bile acid reflux 01/11/2014  . Irritable bowel syndrome 10/30/2013  . Loss of weight 09/29/2013  . White coat hypertension 09/26/2013  . Screening for colon cancer 07/28/2012  . Hyperlipidemia   . Chest pain at rest 12/09/2011  . Diabetes mellitus type 2, uncontrolled   . Hypertension   . Coronary artery disease   . Pacemaker     Subjective:  CC:   Chief Complaint  Patient presents with  . Back Pain    Radiates to abdomen X 6 months    HPI:   Edwin MayersDavid N Evans is a 73 y.o. male who presents for   Persistent low Back pain and constipation.  Patient returns after undergoing treatment for constipation noted on plain films of lumbar spine.  His constipation was not clinically evident,  But he reports that the lactulose was effective,  He is now using a BFL daily  His back pain continues to be present with all activity but rest.  Plain films noted significant scoliosis and degenerative changes at multiple levels with disc space loss at the Lr-L5 level and facet hypertrophy at mutiple levels.   His pain improves with supine position but recurs with activity,  It is not severe enough to request narcotics at this point,  But he is requesting additional imaging and treatment options.  He has been wearing a copper back band whch helps somewhat by giving him some lumbar support.      Has a pacer.     Past Medical History  Diagnosis Date  . Diabetes mellitus   .  Hypertension   . Coronary artery disease 2003    Noncritical stenoses by prior catheterization  . 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 GERD (gastroesophageal reflux disease)   . Gastroesophageal reflux disease with hiatal hernia   . Hyperlipidemia     Past Surgical History  Procedure Laterality Date  . Cholecystectomy    . Pacemaker placement         The following portions of the patient's history were reviewed and updated as appropriate: Allergies, current medications, and problem list.    Review of Systems:   Patient denies headache, fevers, malaise, unintentional weight loss, skin rash, eye pain, sinus congestion and sinus pain, sore throat, dysphagia,  hemoptysis , cough, dyspnea, wheezing, chest pain, palpitations, orthopnea, edema, abdominal pain, nausea, melena, diarrhea, constipation, flank pain, dysuria, hematuria, urinary  Frequency, nocturia, numbness, tingling, seizures,  Focal weakness, Loss of consciousness,  Tremor, insomnia, depression, anxiety, and suicidal ideation.     History   Social History  . Marital Status: Married    Spouse Name: N/A    Number of Children: N/A  . Years of Education: N/A   Occupational History  . Not on file.   Social History Main Topics  . Smoking status: Never Smoker   . Smokeless tobacco: Never Used  . Alcohol Use: Yes  . Drug Use: No  . Sexual Activity: Not on file   Other Topics  Concern  . Not on file   Social History Narrative  . No narrative on file    Objective:  Filed Vitals:   02/24/14 1827  BP: 160/70  Pulse: 53  Temp: 98 F (36.7 C)  Resp: 16     General appearance: alert, cooperative and appears stated age Ears: normal TM's and external ear canals both ears Throat: lips, mucosa, and tongue normal; teeth and gums normal Neck: no adenopathy, no carotid bruit, supple, symmetrical, trachea midline and thyroid not enlarged, symmetric, no tenderness/mass/nodules Back: symmetric, no  curvature. ROM normal. No CVA tenderness. Lungs: clear to auscultation bilaterally Heart: regular rate and rhythm, S1, S2 normal, no murmur, click, rub or gallop Abdomen: soft, non-tender; bowel sounds normal; no masses,  no organomegaly Pulses: 2+ and symmetric Skin: Skin color, texture, turgor normal. No rashes or lesions Lymph nodes: Cervical, supraclavicular, and axillary nodes normal.  Assessment and Plan:  Degenerative joint disease (DJD) of lumbar spine CT of lumbar spine ordered in anticipation of referral to neurosurgery vs Pain clinic. Patient wants to avoid narcotics use.   Noncompliance with immunization regimen Patient refuses influenza, pneumonia and TDaP vaccines again despite discussion of risks and benefits.   Diverticulosis of colon without hemorrhage Constipation addressed.  A total of 25 minutes was spent with patient more than half of which was spent in counseling patient on the above mentioned issues , reviewing and explaining recent  imaging studies with patient, and coordination of care.  Updated Medication List Outpatient Encounter Prescriptions as of 02/24/2014  Medication Sig  . alum hydroxide-mag trisilicate (GAVISCON) 80-20 MG CHEW Chew 2 tablets by mouth 2 (two) times daily as needed.  [name]. aspirin 81 MG tablet Take 81 mg by mouth daily.  . Cholecalciferol (VITAMIN D3) 1000 UNITS CAPS Take 4,000 Units by mouth daily.   [name]. CINNAMON PO Take by mouth 3 (three) times daily after meals.  . cyclobenzaprine (FLEXERIL) 10 MG tablet Take 1 tablet (10 mg total) by mouth 3 (three) times daily as needed for muscle spasms.  [name]. dicyclomine (BENTYL) 20 MG tablet Take 1 tablet (20 mg total) by mouth 4 (four) times daily -  before meals and at bedtime.  . fish oil-omega-3 fatty acids 1000 MG capsule Take 2 g by mouth daily.    . fluticasone (FLONASE) 50 MCG/ACT nasal spray Place 2 sprays into both nostrils daily as needed.  . hydrALAZINE (APRESOLINE) 50 MG tablet Take 1 tablet  (50 mg total) by mouth 3 (three) times daily.  . insulin detemir (LEVEMIR) 100 UNIT/ML injection Inject 0.05 mLs (5 Units total) into the skin daily.  . Insulin Regular Human (AFREZZA) 4 UNITS POWD Inhale 1 application into the lungs 2 (two) times daily.  . Lactulose 20 GM/30ML SOLN Take 30 mLs (20 g total) by mouth every 6 (six) hours as needed. Until constipation is relieved  . losartan (COZAAR) 100 MG tablet Take 100 mg by mouth daily.    [name]. PRILOSEC OTC 20 MG tablet Take 1 tablet by mouth daily.  . sucralfate (CARAFATE) 1 G tablet Take 1 g by mouth 3 (three) times daily.   . TURMERIC PO Take 1,000 mg by mouth daily.   . vitamin B-12 (CYANOCOBALAMIN) 100 MCG tablet Take 50 mcg by mouth daily.       Orders Placed This Encounter  Procedures  . CT Lumbar Spine Wo Contrast    No Follow-up on file.

## 2014-02-24 NOTE — Progress Notes (Signed)
Pre-visit discussion using our clinic review tool. No additional management support is needed unless otherwise documented below in the visit note.  

## 2014-02-25 DIAGNOSIS — K5730 Diverticulosis of large intestine without perforation or abscess without bleeding: Secondary | ICD-10-CM | POA: Insufficient documentation

## 2014-02-25 NOTE — Assessment & Plan Note (Signed)
Constipation addressed.

## 2014-02-25 NOTE — Assessment & Plan Note (Signed)
CT of lumbar spine ordered in anticipation of referral to neurosurgery vs Pain clinic. Patient wants to avoid narcotics use.

## 2014-02-25 NOTE — Assessment & Plan Note (Addendum)
Patient refuses influenza, pneumonia and TDaP vaccines again despite discussion of risks and benefits.

## 2014-02-26 ENCOUNTER — Ambulatory Visit: Payer: Self-pay | Admitting: Internal Medicine

## 2014-03-21 ENCOUNTER — Encounter: Payer: Self-pay | Admitting: Internal Medicine

## 2014-03-31 ENCOUNTER — Telehealth: Payer: Self-pay

## 2014-03-31 NOTE — Telephone Encounter (Signed)
Your CT of the lumbar spine  suggested that  degenerative changes at multiple levels have caused bulging disks at multiple levels but not enough to cause spinal stenosis or nerve root impingement.   I advise you to incorporate core strengthening exercises into your regular exercise routine and avoid lifting or laboring in a way that places undue weight bearing and stress on lower spine. If you would like a referral to Physical Therapy for help in learning these exercises ,   I can make referral

## 2014-03-31 NOTE — Telephone Encounter (Signed)
Pt notified, has been doing some exercises, says he will discuss if PT needed at his 05/07/14 appt.

## 2014-03-31 NOTE — Telephone Encounter (Signed)
The patient called hoping to get his test results from a cat scan he had done.  Thanks!

## 2014-03-31 NOTE — Telephone Encounter (Signed)
Please advise, results are scanned in Epic

## 2014-04-02 ENCOUNTER — Other Ambulatory Visit: Payer: Medicare Other

## 2014-04-03 ENCOUNTER — Encounter: Payer: Medicare Other | Admitting: Internal Medicine

## 2014-05-05 ENCOUNTER — Other Ambulatory Visit (INDEPENDENT_AMBULATORY_CARE_PROVIDER_SITE_OTHER): Payer: Medicare PPO

## 2014-05-05 DIAGNOSIS — Z79899 Other long term (current) drug therapy: Secondary | ICD-10-CM

## 2014-05-05 DIAGNOSIS — Z125 Encounter for screening for malignant neoplasm of prostate: Secondary | ICD-10-CM

## 2014-05-05 DIAGNOSIS — E785 Hyperlipidemia, unspecified: Secondary | ICD-10-CM

## 2014-05-05 DIAGNOSIS — E559 Vitamin D deficiency, unspecified: Secondary | ICD-10-CM

## 2014-05-05 LAB — COMPREHENSIVE METABOLIC PANEL
ALK PHOS: 79 U/L (ref 39–117)
ALT: 16 U/L (ref 0–53)
AST: 17 U/L (ref 0–37)
Albumin: 4 g/dL (ref 3.5–5.2)
BILIRUBIN TOTAL: 0.9 mg/dL (ref 0.2–1.2)
BUN: 27 mg/dL — ABNORMAL HIGH (ref 6–23)
CO2: 32 meq/L (ref 19–32)
Calcium: 9.5 mg/dL (ref 8.4–10.5)
Chloride: 103 mEq/L (ref 96–112)
Creatinine, Ser: 0.9 mg/dL (ref 0.4–1.5)
GFR: 86.45 mL/min (ref >60.00)
Glucose, Bld: 186 mg/dL — ABNORMAL HIGH (ref 70–99)
POTASSIUM: 4.2 meq/L (ref 3.5–5.1)
SODIUM: 141 meq/L (ref 135–145)
TOTAL PROTEIN: 6.4 g/dL (ref 6.0–8.3)

## 2014-05-05 LAB — LIPID PANEL
Cholesterol: 144 mg/dL (ref 0–200)
HDL: 48.7 mg/dL (ref >39.00)
LDL Cholesterol: 87 mg/dL (ref 0–99)
NONHDL: 95.3
Total CHOL/HDL Ratio: 3
Triglycerides: 43 mg/dL (ref 0.0–149.0)
VLDL: 8.6 mg/dL (ref 0.0–40.0)

## 2014-05-05 LAB — PSA, MEDICARE: PSA: 0.85 ng/ml (ref 0.10–4.00)

## 2014-05-05 LAB — VITAMIN D 25 HYDROXY (VIT D DEFICIENCY, FRACTURES): VITD: 33.34 ng/mL (ref 30.00–100.00)

## 2014-05-06 ENCOUNTER — Encounter: Payer: Self-pay | Admitting: *Deleted

## 2014-05-07 ENCOUNTER — Encounter: Payer: Medicare Other | Admitting: Internal Medicine

## 2014-06-02 ENCOUNTER — Encounter: Payer: Self-pay | Admitting: Internal Medicine

## 2014-06-02 ENCOUNTER — Ambulatory Visit (INDEPENDENT_AMBULATORY_CARE_PROVIDER_SITE_OTHER): Payer: Medicare PPO | Admitting: Internal Medicine

## 2014-06-02 VITALS — BP 160/78 | HR 64 | Temp 98.3°F | Resp 16 | Ht 72.75 in | Wt 179.0 lb

## 2014-06-02 DIAGNOSIS — M549 Dorsalgia, unspecified: Secondary | ICD-10-CM

## 2014-06-02 DIAGNOSIS — IMO0002 Reserved for concepts with insufficient information to code with codable children: Secondary | ICD-10-CM

## 2014-06-02 DIAGNOSIS — E1165 Type 2 diabetes mellitus with hyperglycemia: Secondary | ICD-10-CM

## 2014-06-02 DIAGNOSIS — Z0000 Encounter for general adult medical examination without abnormal findings: Secondary | ICD-10-CM

## 2014-06-02 DIAGNOSIS — R634 Abnormal weight loss: Secondary | ICD-10-CM

## 2014-06-02 NOTE — Progress Notes (Signed)
Patient ID: Edwin Evans, male   DOB: 08/14/1940, 74 y.o.   MRN: [id number]   The patient is here for annual Medicare wellness examination and management of other chronic and acute problems. He continues to have abdominal pain, concern for pancreatic failure and is having a CT done of his pancreas. Using inhaled insulin now,     appetite improving but had a low sugar of 50 .  So he omits the dose if his blood sugar is 170 or less.  Some right shoulder,  Right knee pain   Not bad,  DJD likely The risk factors are reflected in the social history.      Starting to feel better,  Back pain has improved with core strengthening core muscles.    The roster of all physicians providing medical care to patient - is listed in the Snapshot section of the chart.  Activities of daily living:  The patient is 100% independent in all ADLs: dressing, toileting, feeding as well as independent mobility  Home safety : The patient has smoke detectors in the home. They wear seatbelts.  There are no firearms at home. There is no violence in the home.   There is no risks for hepatitis, STDs or HIV. There is no   history of blood transfusion. They have no travel history to infectious disease endemic areas of the world.  The patient has seen their dentist in the last six month. They have seen their eye doctor in the last year. They admit to slight hearing difficulty with regard to whispered voices and some television programs.  They have deferred audiologic testing in the last year.  They do not  have excessive sun exposure. Discussed the need for sun protection: hats, long sleeves and use of sunscreen if there is significant sun exposure.   Diet: the importance of a healthy diet is discussed. They do have a healthy diet.  The benefits of regular aerobic exercise were discussed. She walks 4 times per week ,  20 minutes.   Depression screen: there are no signs or vegative symptoms of depression- irritability, change in  appetite, anhedonia, sadness/tearfullness.  Cognitive assessment: the patient manages all their financial and personal affairs and is actively engaged. They could relate day,date,year and events; recalled 2/3 objects at 3 minutes; performed clock-face test normally.  The following portions of the patient's history were reviewed and updated as appropriate: allergies, current medications, past family history, past medical history,  past surgical history, past social history  and problem list.  Visual acuity was not assessed per patient preference since she has regular follow up with her ophthalmologist. Hearing and body mass index were assessed and reviewed.   During the course of the visit the patient was educated and counseled about appropriate screening and preventive services including : fall prevention , diabetes screening, nutrition counseling, colorectal cancer screening, and recommended immunizations.    Review of Systems:  Patient denies headache, fevers, malaise, unintentional weight loss, skin rash, eye pain, sinus congestion and sinus pain, sore throat, dysphagia,  hemoptysis , cough, dyspnea, wheezing, chest pain, palpitations, orthopnea, edema, abdominal pain, nausea, melena, diarrhea, constipation, flank pain, dysuria, hematuria, urinary  Frequency, nocturia, numbness, tingling, seizures,  Focal weakness, Loss of consciousness,  Tremor, insomnia, depression, anxiety, and suicidal ideation.    Objective:  BP 160/78 mmHg  Pulse 64  Temp(Src) 98.3 F (36.8 C) (Oral)  Resp 16  Ht 6' 0.75" (1.848 m)  Wt 179 lb (81.194 kg)  BMI 23.77  kg/m2  SpO2 97%  General Appearance:    Alert, cooperative, no distress, appears stated age  Head:    Normocephalic, without obvious abnormality, atraumatic  Eyes:    PERRL, conjunctiva/corneas clear, EOM's intact, fundi    benign, both eyes       Ears:    Normal TM's and external ear canals, both ears  Nose:   Nares normal, septum midline, mucosa  normal, no drainage   or sinus tenderness  Throat:   Lips, mucosa, and tongue normal; teeth and gums normal  Neck:   Supple, symmetrical, trachea midline, no adenopathy;       thyroid:  No enlargement/tenderness/nodules; no carotid   bruit or JVD  Back:     Symmetric, no curvature, ROM normal, no CVA tenderness  Lungs:     Clear to auscultation bilaterally, respirations unlabored  Chest wall:    No tenderness or deformity  Heart:    Regular rate and rhythm, S1 and S2 normal, no murmur, rub   or gallop  Abdomen:     Soft, non-tender, bowel sounds active all four quadrants,    no masses, no organomegaly  Genitalia:    Normal male without lesion, discharge or tenderness  Rectal:    Normal tone, normal prostate, no masses or tenderness;   guaiac negative stool  Extremities:   Extremities normal, atraumatic, no cyanosis or edema  Pulses:   2+ and symmetric all extremities  Skin:   Skin color, texture, turgor normal, no rashes or lesions  Lymph nodes:   Cervical, supraclavicular, and axillary nodes normal  Neurologic:   CNII-XII intact. Normal strength, sensation and reflexes      throughout    Assessment and Plan:  Problem List Items Addressed This Visit    Back pain without radiation    Improved,  Nearly resolved with exercise .      Diabetes mellitus type 2, uncontrolled    Managed by Dr. Renae FicklePaul, now using inhaled insulin, which he is using sparingly       Loss of weight - Primary    Resolved,  [facility]      Medicare annual wellness visit, subsequent    Annual Medicare wellness  exam was done as well as a comprehensive physical exam and management of acute and chronic conditions .  During the course of the visit the patient was educated and counseled about appropriate screening and preventive services including : fall prevention , diabetes screening, nutrition counseling, colorectal cancer screening, and recommended immunizations.  Printed  recommendations for health maintenance screenings was given.

## 2014-06-02 NOTE — Patient Instructions (Signed)

## 2014-06-02 NOTE — Progress Notes (Signed)
Pre-visit discussion using our clinic review tool. No additional management support is needed unless otherwise documented below in the visit note.  

## 2014-06-02 NOTE — Assessment & Plan Note (Signed)
Resolved,  [facility]

## 2014-06-03 ENCOUNTER — Ambulatory Visit: Payer: Self-pay | Admitting: Gastroenterology

## 2014-06-03 DIAGNOSIS — Z0000 Encounter for general adult medical examination without abnormal findings: Secondary | ICD-10-CM | POA: Insufficient documentation

## 2014-06-03 NOTE — Assessment & Plan Note (Signed)

## 2014-06-03 NOTE — Assessment & Plan Note (Signed)
Managed by Dr. Renae FicklePaul, now using inhaled insulin, which he is using sparingly

## 2014-06-03 NOTE — Assessment & Plan Note (Signed)
Improved,  Nearly resolved with exercise .

## 2014-11-19 ENCOUNTER — Other Ambulatory Visit: Payer: Self-pay

## 2014-11-19 DIAGNOSIS — K219 Gastro-esophageal reflux disease without esophagitis: Secondary | ICD-10-CM

## 2014-11-19 DIAGNOSIS — K2100 Gastro-esophageal reflux disease with esophagitis, without bleeding: Secondary | ICD-10-CM

## 2014-11-25 ENCOUNTER — Ambulatory Visit
Admission: RE | Admit: 2014-11-25 | Discharge: 2014-11-25 | Disposition: A | Discharge status: Home / Self Care | Payer: Medicare PPO | Source: Ambulatory Visit | Attending: Surgery | Admitting: Surgery

## 2014-11-27 ENCOUNTER — Encounter: Payer: Self-pay | Admitting: Surgery

## 2014-11-27 ENCOUNTER — Other Ambulatory Visit: Payer: Self-pay | Admitting: Surgery

## 2014-11-27 DIAGNOSIS — K219 Gastro-esophageal reflux disease without esophagitis: Secondary | ICD-10-CM

## 2014-11-27 MED ORDER — METOCLOPRAMIDE HCL 5 MG PO TABS: 5.0000 mg | ORAL_TABLET | Freq: Three times a day (TID) | ORAL | Status: AC

## 2014-11-27 NOTE — Progress Notes (Unsigned)
Patient ID: CLAIR ALFIERI, male   DOB: 12-26-1940, 74 y.o.   MRN: [id number] Mr. Bucher called me last week about his recurrent GERD symptoms.  I reviewed the [facility] UGI report from 2 years ago and I scheduled a repeat UGI.    Dr. Gery Pray showed Mr. Sedore the study and I phoned Mr. Cutting and reviewed it with him.    When he lifts, it aggravates this chest pain that has been evaluated by cardiology and thought to be reflux.  He has a small hiatal hernia with free reflux, tertiary contractions in the esophagus but significantly he has some aspiraion of contrast.  He has no history of pneumonia.    I discussed trying a promotility agent such as Reglan.  I explained the complications that have been reported with this drug.  He is willing to try this.  I will place an order for one month of Reglan 5 mg PO 30 min AC and HS.    Matt B. Daphine Deutscher, MD, [facility] [phone] beeper [phone]  11/27/2014 1:53 PM

## 2015-05-09 ENCOUNTER — Emergency Department
Admission: EM | Admit: 2015-05-09 | Discharge: 2015-06-03 | Disposition: E | Payer: Medicare Other | Attending: Emergency Medicine | Admitting: Emergency Medicine

## 2015-05-09 ENCOUNTER — Telehealth: Payer: Self-pay | Admitting: Family Medicine

## 2015-05-09 ENCOUNTER — Encounter: Payer: Self-pay | Admitting: Internal Medicine

## 2015-05-09 DIAGNOSIS — Z79899 Other long term (current) drug therapy: Secondary | ICD-10-CM | POA: Insufficient documentation

## 2015-05-09 DIAGNOSIS — I469 Cardiac arrest, cause unspecified: Secondary | ICD-10-CM | POA: Diagnosis not present

## 2015-05-09 DIAGNOSIS — Z794 Long term (current) use of insulin: Secondary | ICD-10-CM | POA: Insufficient documentation

## 2015-05-09 DIAGNOSIS — Z7982 Long term (current) use of aspirin: Secondary | ICD-10-CM | POA: Insufficient documentation

## 2015-05-09 DIAGNOSIS — I10 Essential (primary) hypertension: Secondary | ICD-10-CM | POA: Diagnosis not present

## 2015-05-09 DIAGNOSIS — E119 Type 2 diabetes mellitus without complications: Secondary | ICD-10-CM | POA: Diagnosis not present

## 2015-05-09 MED ORDER — DEXTROSE 5 % IV SOLN
150.0000 mg | INTRAVENOUS | Status: AC | PRN
  Administered 2015-05-09: 150 mg via INTRAVENOUS

## 2015-05-11 ENCOUNTER — Telehealth: Payer: Self-pay

## 2015-05-11 MED FILL — Medication: Qty: 1 | Status: AC

## 2015-05-11 NOTE — Telephone Encounter (Signed)
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  FYI. Thanks

## 2015-05-12 ENCOUNTER — Telehealth: Payer: Self-pay | Admitting: Internal Medicine

## 2015-05-12 NOTE — Telephone Encounter (Signed)
Death certificate has been completed and returned in red folder

## 2015-05-12 NOTE — Telephone Encounter (Signed)
[facility] [employer] [facility] dropped off Certificate of Death to be filled out.Jovita Gamma. Gave to [place].. Please advise [facility] [employer] [facility] Mornhompson when ready to be picked up [phone]. Thanks

## 2015-05-12 NOTE — Telephone Encounter (Signed)
Placed death certificate in red folder.

## 2015-05-13 NOTE — Telephone Encounter (Signed)
Notified [facility] death certificate ready for pick up. Placed copy to chart and placed original at front desk.

## 2015-06-03 DIAGNOSIS — 419620001 Death: Secondary | SNOMED CT

## 2015-06-03 NOTE — Code Documentation (Addendum)
Pulse check and dr. Scotty CourtStafford evaluating heart activity with US - no activity palpated or seen via US.  PEA rate 20 noted on Zoll monitor  Compressions resumed

## 2015-06-03 NOTE — Code Documentation (Addendum)
Pt to rm 11 via EMS, EMS report pt found unresponsive 30min PTA, briefly had ROSC w/ HR 130, went to vfib, shocked 6 times, epi x 5, 300 amiodarone, IV boluses running of NS. Samuel BoucheLucas in place upon arrival, lucas removed and manual compressions started

## 2015-06-03 NOTE — ED Notes (Signed)
Nursing supervisor notified that family is leaving ED

## 2015-06-03 NOTE — ED Notes (Signed)
Body cleaned, airway and [facility]s [facility] and [place], NT.

## 2015-06-03 NOTE — ED Provider Notes (Signed)
[facility] Emergency Department Provider Note  ____________________________________________  Time seen: 7:30 PM  I have reviewed the triage vital signs and the nursing notes.   HISTORY  Chief Complaint Cardiac Arrest  History Limited due to cardiac arrest. Obtained from fire department and EMS Additional history obtained from family after the fact HPI Edwin Evans is a 75 y.o. male with a history of hypertension diabetes and cardiac disease with coronary artery stents who is brought to the ED in cardiac arrest.The patient was apparently complaining of chest tightness throughout the afternoon. 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 He was found to be unresponsive by his son who initiated CPR and called EMS.  Fire Department report that the patient was found to be in V. fib and they shocked him twice. EMS gave 300 of amiodarone and multiple rounds of epinephrine as well as 2 defibrillation shocks. The report a brief episode of spontaneous tachycardia with a rate of about 1:30 which lasted for about 2 minutes before the patient went into PEA. He remained in PE A for the remainder of the transport.  No history of trauma.   Past Medical History  Diagnosis Date  . Diabetes mellitus   . Hypertension   . Coronary artery disease 2003    Noncritical stenoses by prior catheterization  . 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 GERD (gastroesophageal reflux disease)   . Gastroesophageal reflux disease with hiatal hernia   . Hyperlipidemia      Patient Active Problem List   Diagnosis Date Noted  . Medicare annual wellness visit, subsequent 06/03/2014  . Diverticulosis of colon without hemorrhage 02/25/2014  . Sliding hiatal hernia 01/21/2014  . Back pain without radiation 01/21/2014  . Noncompliance with medication treatment due to  intermittent use of medication 01/11/2014  . Noncompliance with immunization regimen 01/11/2014  . Degenerative joint disease (DJD) of lumbar spine 01/11/2014  . Esophageal dysmotility 01/11/2014  . Gastritis, bile acid reflux 01/11/2014  . Irritable bowel syndrome 10/30/2013  . Loss of weight 09/29/2013  . White coat hypertension 09/26/2013  . Screening for colon cancer 07/28/2012  . Hyperlipidemia   . Chest pain at rest 12/09/2011  . Diabetes mellitus type 2, uncontrolled (HCC)   . Hypertension   . Coronary artery disease   . Pacemaker      Past Surgical History  Procedure Laterality Date  . Cholecystectomy    . Pacemaker placement       Current Outpatient Rx  Name  Route  Sig  Dispense  Refill  . alum hydroxide-mag trisilicate (GAVISCON) 80-20 MG CHEW   Oral   Chew 2 tablets by mouth 2 (two) times daily as needed.         [name] aspirin 81 MG tablet   Oral   Take 81 mg by mouth daily.         . Cholecalciferol (VITAMIN D3) 1000 UNITS CAPS   Oral   Take 4,000 Units by mouth daily.          . cyclobenzaprine (FLEXERIL) 10 MG tablet   Oral   Take 1 tablet (10 mg total) by mouth 3 (three) times daily as needed for muscle spasms. Patient not taking: Reported on 06/02/2014   90 tablet   2   . dicyclomine (BENTYL) 20 MG tablet   Oral  Take 1 tablet (20 mg total) by mouth 4 (four) times daily -  before meals and at bedtime. Patient not taking: Reported on 06/02/2014   120 tablet   0   . fish oil-omega-3 fatty acids 1000 MG capsule   Oral   Take 2 g by mouth daily.           . fluticasone (FLONASE) 50 MCG/ACT nasal spray   Each Nare   Place 2 sprays into both nostrils daily as needed.         . hydrALAZINE (APRESOLINE) 50 MG tablet   Oral   Take 1 tablet (50 mg total) by mouth 3 (three) times daily.   90 tablet   3   . insulin detemir (LEVEMIR) 100 UNIT/ML injection   Subcutaneous   Inject 0.05 mLs (5 Units total) into the skin daily.   10 mL   11      Started by Dr Renae FicklePaul June 11   . Insulin Regular Human (AFREZZA) 4 UNITS POWD   Inhalation   Inhale 1 application into the lungs 2 (two) times daily.         . Lactulose 20 GM/30ML SOLN   Oral   Take 30 mLs (20 g total) by mouth every 6 (six) hours as needed. Until constipation is relieved Patient not taking: Reported on 06/02/2014   240 mL   2   . losartan (COZAAR) 100 MG tablet   Oral   Take 100 mg by mouth daily.           . metoCLOPramide (REGLAN) 5 MG tablet   Oral   Take 1 tablet (5 mg total) by mouth 4 (four) times daily -  before meals and at bedtime.   90 tablet   1   . PRILOSEC OTC 20 MG tablet   Oral   Take 1 tablet by mouth daily.         . TURMERIC PO   Oral   Take 1,000 mg by mouth daily.          . vitamin B-12 (CYANOCOBALAMIN) 100 MCG tablet   Oral   Take 50 mcg by mouth daily.              Allergies Ace inhibitors; Beta adrenergic blockers; Calcium channel blockers; Metformin and related; and Pioglitazone   Family History  Problem Relation Age of Onset  . Arthritis Mother   . Cancer Mother   . Diabetes Mother   . Arthritis Father   . Hyperlipidemia Father   . Heart disease Father     Social History Social History  Substance Use Topics  . Smoking status: Never Smoker   . Smokeless tobacco: Never Used  . Alcohol Use: Yes    Review of Systems Unable to obtain ____________________________________________   PHYSICAL EXAM:  VITAL SIGNS: ED Triage Vitals  Enc Vitals Group     BP Dec 14, 2015 1950 161/117 mmHg     Pulse --      Resp --      Temp --      Temp src --      SpO2 Dec 14, 2015 1950 48 %     Weight Dec 14, 2015 1955 192 lb (87.091 kg)     Height --      Head Cir --      Peak Flow --      Pain Score --      Pain Loc --      Pain Edu? --  Excl. in GC? --     Vital signs reviewed, nursing assessments reviewed.   Constitutional:   Unresponsive. Eyes:   Fixed and dilated 5 mm bilaterally. No movement ENT    Head:   Normocephalic and atraumatic.   Nose:   No bleeding   Mouth/Throat:   King airway in place by EMS. No evidence of trauma.   Neck:   No stridor. No SubQ emphysema. No meningismus. Hematological/Lymphatic/Immunilogical:   No cervical lymphadenopathy. Cardiovascular:   CPR in progress. No peripheral pulses with CPR despite hard and fast compressions with good chest wall depression Respiratory:   Good air entry in all lung fields without focal findings as examined anteriorly while the patient is being bagged Gastrointestinal:   Soft, no ecchymosis. Genitourinary:   Unremarkable genitalia Musculoskeletal:   No evidence of trauma, no deformities. Joints appear stable. Neurologic:   Unresponsive No cranial nerve activity or brainstem reflexes  Skin:    Skin is warm, dry and intact. No rash noted.  No petechiae, purpura, or bullae. Cold extremities ____________________________________________    LABS (pertinent positives/negatives) (all labs ordered are listed, but only abnormal results are displayed) Labs Reviewed - No data to display ____________________________________________   EKG    ____________________________________________    RADIOLOGY    ____________________________________________   PROCEDURES CRITICAL CARE Performed by: Scotty Court, Yoshiaki Kreuser   Total critical care time: 30 minutes  Critical care time was exclusive of separately billable procedures and treating other patients.  Critical care was necessary to treat or prevent imminent or life-threatening deterioration.  Critical care was time spent personally by me on the following activities: Preparation for patient arrival from EMS, development of treatment plan with patient and/or surrogate as well as nursing, discussions with consultants, evaluation of patient's response to treatment, examination of patient, obtaining history from patient or surrogate, ordering and performing treatments and  interventions, ordering and review of laboratory studies, ordering and review of radiographic studies, pulse oximetry and re-evaluation of patient's condition. Total time from patient arrival to pronouncing death 25 minutes  ____________________________________________   INITIAL IMPRESSION / ASSESSMENT AND PLAN / ED COURSE  Pertinent labs & imaging results that were available during my care of the patient were reviewed by me and considered in my medical decision making (see chart for details).  Patient arrived in cardiopulmonary arrest with adequate artificial respirations provided via [facility] airway. Initial rhythm check shows PEA with bedside cardiac ultrasound performed by me showing cardiac standstill without any evidence of pericardial effusion. Patient was given an additional dose of amiodarone as well as a few more rounds of CPR with persistent cardiac standstill and PEA. After total down time of about 75 minutes, further resuscitative efforts were terminated and the patient was announced disease.  ----------------------------------------- 8:31 PM on 05/31/2015 -----------------------------------------  Events in patient condition discussed with the family with the chaplain. We'll bring them to the patient's bedside as soon as possible. Case was discussed with the medical examiner who notes that this does not appear to be a suitable case for them to investigate, and we'll defer death certificate To primary care.  Suspect the cause of death is massive myocardial infarction secondary to coronary artery disease.     ____________________________________________   FINAL CLINICAL IMPRESSION(S) / ED DIAGNOSES  Final diagnoses:  Cardiac arrest [facility])  Death due to natural causes      Sharman Cheek, MD 05/06/2015 2032

## 2015-06-03 NOTE — Telephone Encounter (Signed)
Patient died of MI and presented to [facility]- unable to be revived.Death certificiate will be sent to Dr. Darrick Huntsmanullo. No medical examiner planned- discussed with family.

## 2015-06-03 NOTE — ED Provider Notes (Signed)
Attempted to page the patient's primary care physician [facility]. Answering service was unsure of how to reach her. Have not been able to obtain a call back yet.  Sharman CheekPhillip Kensli Bowley, MD 05/19/2015 [id number]

## 2015-06-03 NOTE — ED Provider Notes (Signed)
 -----------------------------------------   7:03 AM on 05/25/2015 -----------------------------------------   Late entry note.  This note is for documentation of ultrasound study performed during resuscitative efforts in emergency department during evaluation on 05/15/2015.    EMERGENCY DEPARTMENT [place] CARDIAC EXAM "Study: Limited Ultrasound of the heart and pericardium"  INDICATIONS: Cardiac arrest  Multiple views of the heart and pericardium were obtained in real-time with a multi-frequency probe.  PERFORMED BY: Ereka Brau  IMAGES ARCHIVED?: No  FINDINGS: No pericardial effusion and No cardiac activity  LIMITATIONS:  Emergent procedure  VIEWS USED: Subcostal 4 chamber  INTERPRETATION: Cardiac activity absent, Pericardial effusioin absent and Cardiac tamponade absent     Sharman Cheek, MD 05/25/15 [id number]

## 2015-06-03 NOTE — ED Notes (Signed)
Family and chaplain at bedside  

## 2015-06-03 NOTE — ED Notes (Signed)
Per Dr. Scotty CourtStafford, attempted to contact pt's PCP, Dr. Darrick Huntsmanullo, and unable to contact and left message w/ answering service

## 2015-06-03 DEATH — deceased

## 2016-03-05 IMAGING — CT CT ABDOMEN W/ CM
2 of 5 series · 16 of 46 positions shown, 18 images · IV contrast (omnipaque)
Comparison: None.

CLINICAL DATA: Upper abdominal pain, nausea, weight loss

EXAM:
CT ABDOMEN WITH CONTRAST
TECHNIQUE: Multidetector CT imaging of the abdomen was performed using the
standard protocol following bolus administration of intravenous
contrast.
CONTRAST:  80 mL Omnipaque 300 IV

[Series 2: routine with · axial · 0.77mm/px · z∈[-913,-673]mm · 13 of 58 slices shown, 15 images]
[im 5/58  soft-tissue]
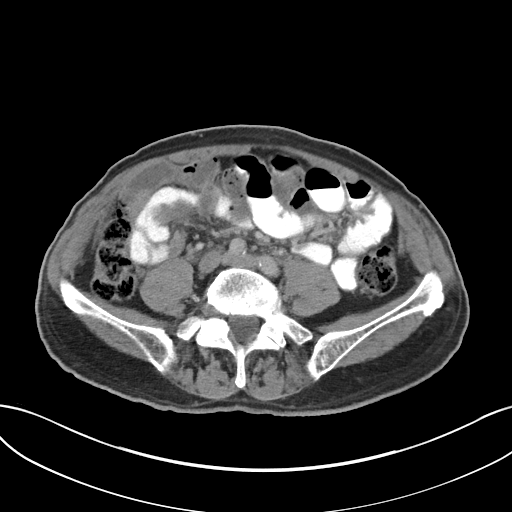
[im 5/58  bone]
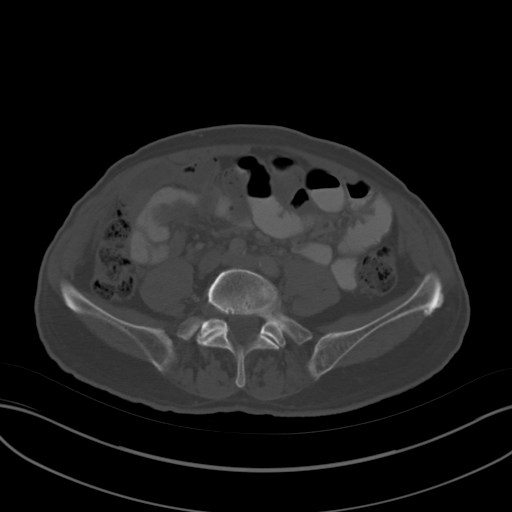
[im 9/58  soft-tissue]
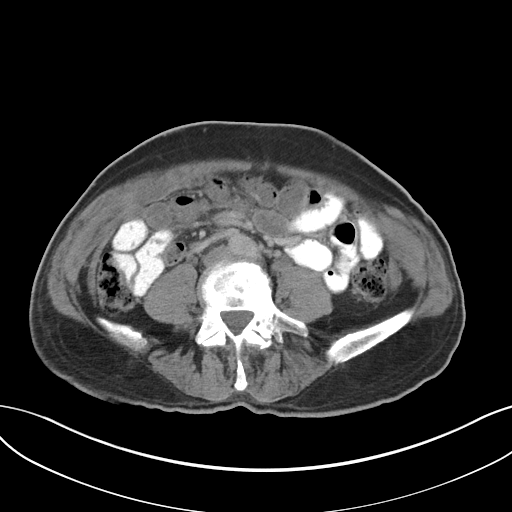
[im 13/58  soft-tissue]
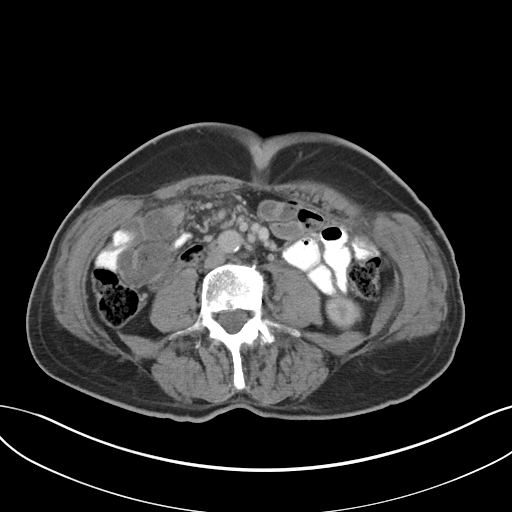
[im 17/58  soft-tissue]
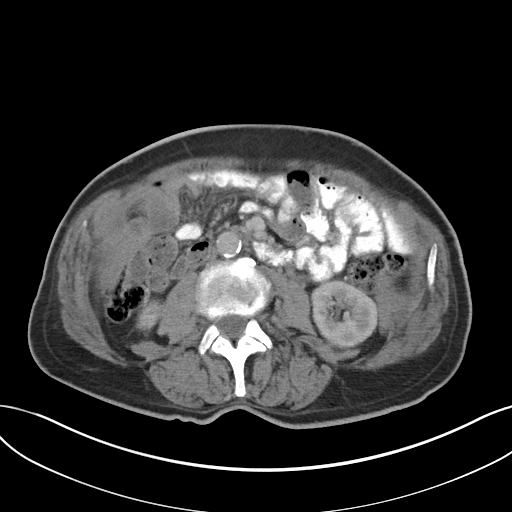
[im 21/58  soft-tissue]
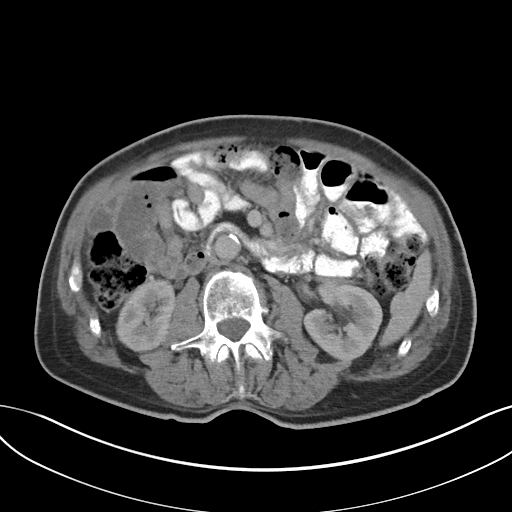
[im 25/58  soft-tissue]
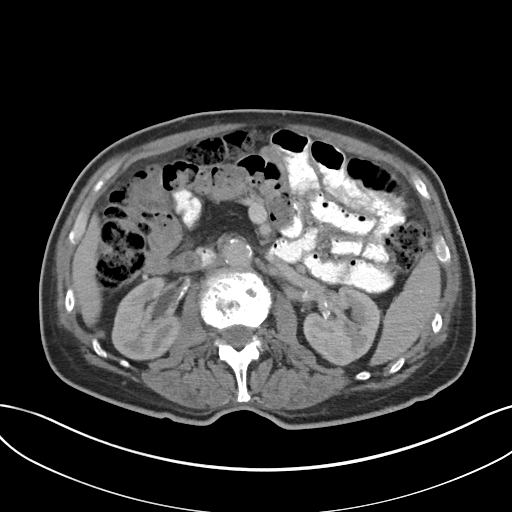
[im 29/58  soft-tissue]
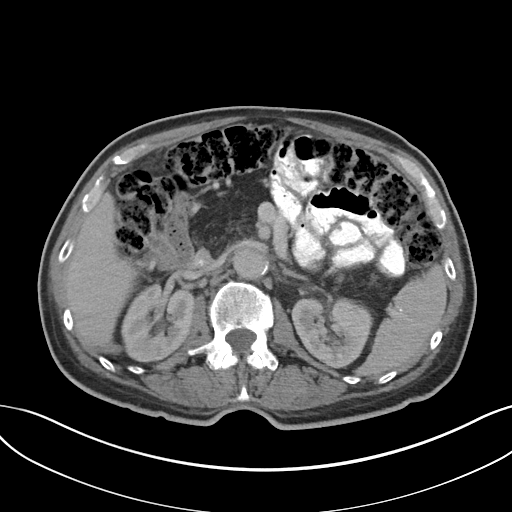
[im 33/58  soft-tissue]
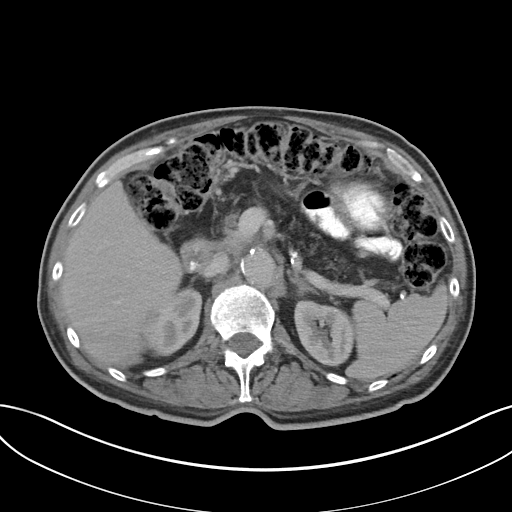
[im 37/58  soft-tissue]
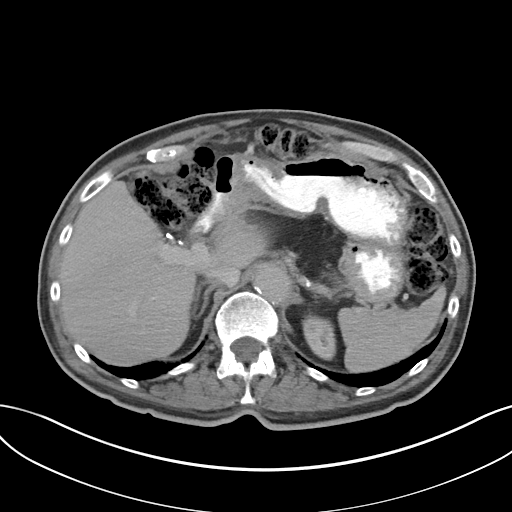
[im 37/58  bone]
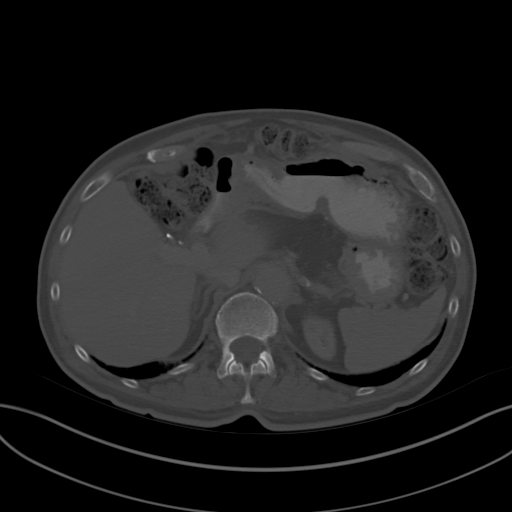
[im 41/58  soft-tissue]
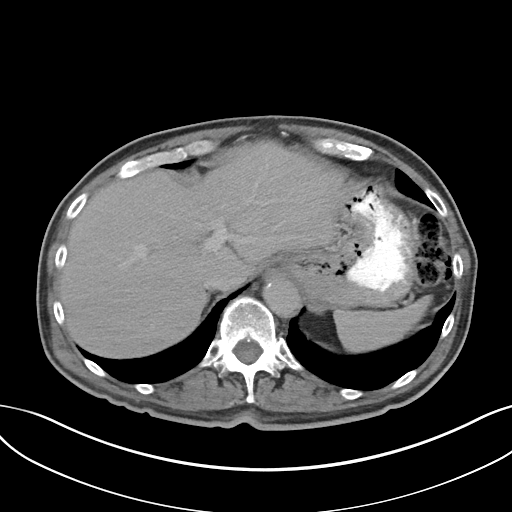
[im 45/58  soft-tissue]
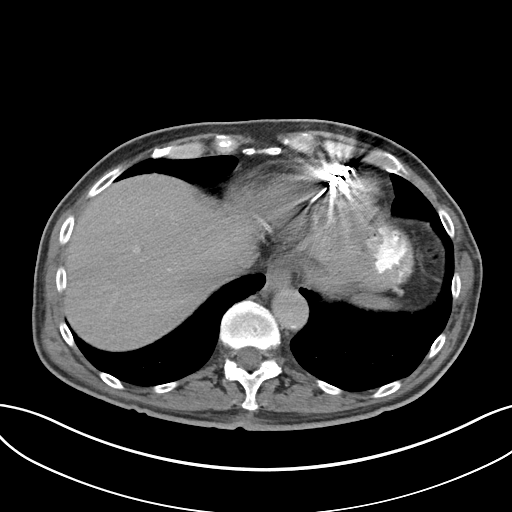
[im 49/58  soft-tissue]
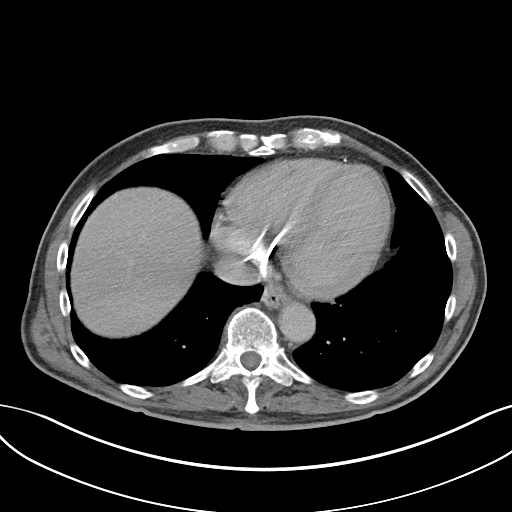
[im 53/58  soft-tissue]
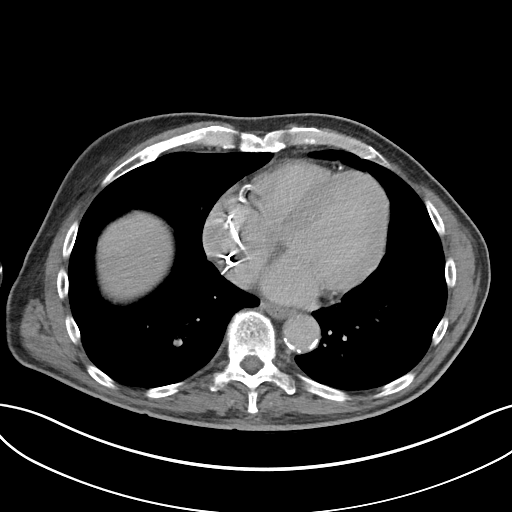

[Series 5: cor routine with · coronal · 0.58mm/px · 3 of 125 slices shown]
[im 42/125  soft-tissue]
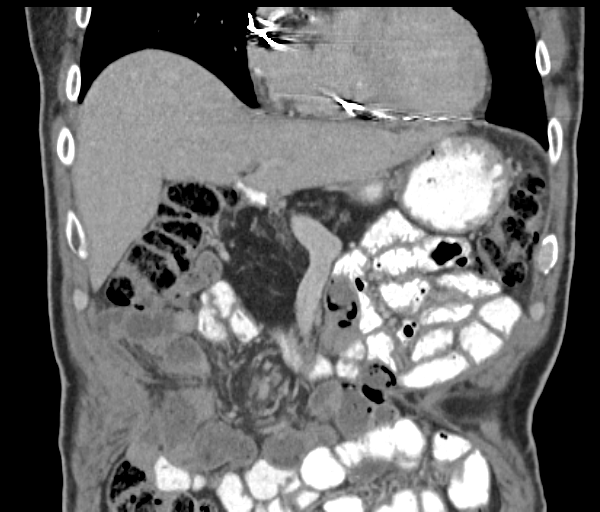
[im 56/125  soft-tissue]
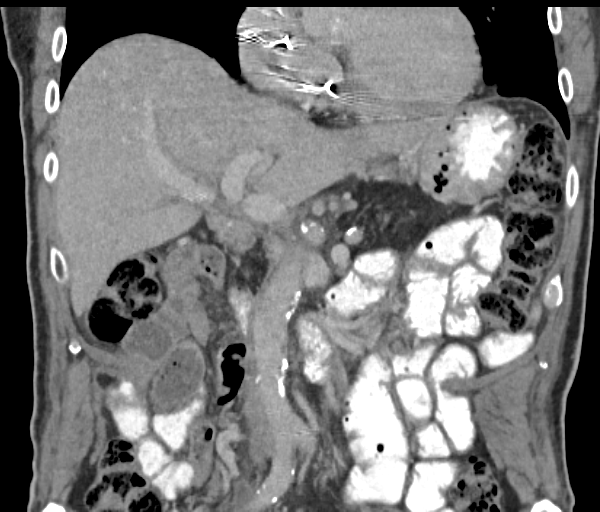
[im 69/125  soft-tissue]
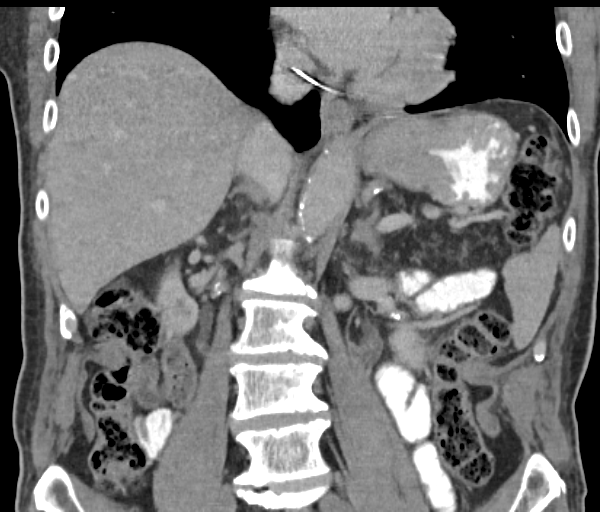

[16 of 46 positions shown; findings below may reference images not displayed]

FINDINGS: Lower chest:  Lung bases are clear.

Pacemaker leads, incompletely visualized.

Hepatobiliary: Liver is within normal limits.

Status post cholecystectomy. No intrahepatic or extrahepatic ductal
dilatation.

Pancreas: Near-complete fatty replacement of the pancreas, likely
reflecting exocrine dysfunction related to diabetes.

Spleen: Within normal limits.

Adrenals/Urinary Tract: Adrenal glands are unremarkable.

Kidneys are within normal limits, noting vascular calcifications. No
hydronephrosis.

Stomach/Bowel: Stomach is within normal limits.

Visualized bowel is unremarkable.

Vascular/Lymphatic: Atherosclerotic calcifications of the abdominal
aorta. No evidence of abdominal aortic aneurysm.

No suspicious abdominal lymphadenopathy.

Other: No abdominal ascites.

Musculoskeletal: Degenerative changes of the lumbar spine.
IMPRESSION: Unremarkable CT abdomen.

Please note that the pelvis was not imaged.

## 2017-01-25 NOTE — Telephone Encounter (Signed)
Error

## 2017-01-25 NOTE — Telephone Encounter (Signed)
meds ordered
